# Patient Record
Sex: Male | Born: 1985
Health system: Southern US, Community
[De-identification: ages and names within clinical notes are randomized; demographics above are authoritative.]

## PROBLEM LIST (undated history)

## (undated) DIAGNOSIS — J45909 Unspecified asthma, uncomplicated: Secondary | ICD-10-CM

## (undated) DIAGNOSIS — Y249XXA Unspecified firearm discharge, undetermined intent, initial encounter: Secondary | ICD-10-CM

## (undated) DIAGNOSIS — I2699 Other pulmonary embolism without acute cor pulmonale: Secondary | ICD-10-CM

## (undated) DIAGNOSIS — W3400XA Accidental discharge from unspecified firearms or gun, initial encounter: Secondary | ICD-10-CM

## (undated) HISTORY — PX: HERNIA REPAIR: SHX51

## (undated) HISTORY — PX: INCISION AND DRAINAGE WOUND WITH FOREIGN BODY REMOVAL: SHX5635

---
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
## 2006-05-23 ENCOUNTER — Ambulatory Visit: Payer: Self-pay | Admitting: Oncology

## 2006-08-07 ENCOUNTER — Ambulatory Visit: Payer: Self-pay | Admitting: Oncology

## 2006-08-25 ENCOUNTER — Inpatient Hospital Stay (HOSPITAL_COMMUNITY): Admission: EM | Admit: 2006-08-25 | Discharge: 2006-08-28 | Payer: Self-pay | Admitting: Emergency Medicine

## 2006-11-13 ENCOUNTER — Ambulatory Visit (HOSPITAL_COMMUNITY): Payer: Self-pay | Admitting: Psychiatry

## 2010-10-25 NOTE — H&P (Signed)
Jamie Hood, Jamie Hood              ACCOUNT NO.:  [id number]   MEDICAL RECORD NO.:  [id number]          PATIENT TYPE:  IPS   LOCATION:  0204                          FACILITY:  [facility]   PHYSICIAN:  Lalla Brothers, MDDATE OF BIRTH:  November 10, 1985   DATE OF ADMISSION:  10/07/2004  DATE OF DISCHARGE:                         PSYCHIATRIC ADMISSION ASSESSMENT   IDENTIFICATION:  This 25 year old male, senior in high school, is admitted  emergently voluntarily in transfer from [facility]  for inpatient psychiatric stabilization and treatment of suicidal ideation,  reporting a disguised suicide plan to make it look like an accident. He  would not disclose further details except that he had overdosed with Aleve  and of possibly other medications in the past. He was documented to be  depressed acutely over the last 2-6 months and to have longstanding  psychiatric uncertainties since the seventh grade.   HISTORY OF PRESENT ILLNESS:  The patient indicates that he doubts mother is  supportive though mother does bring him for the emergency evaluation.  [facility] forwards a summary of their assessment  concluding sufficient risk with his symptoms and stated plans to need  inpatient hospitalization. The patient is not more specific about previous  overdose except to clarify that he had and overdose with Aleve and possibly  other medications at times. He did not seek or receive medical attention to  these and has had no previous mental health treatment. He has continued to  attempt to cope himself with the problems rather than seeking help from  others except possibly from mother who he considers to be not supportive. He  seems to be alienating to mother even as father is not definitely involved  in his daily life. Mother seems exhausted with the patient's problems and  they seem to suggest that biological father may have attempted contact with  the patient  more recently. The patient does not disclose such but indicates  that father has had no contact but he thinks that mother may know that  paternal grandmother may have some mental illness. Patient described  progressive avoidance in daily life since the seventh grade school year. He  suggests his grades have slowly declined since that middle school year so  that he is now failing in his senior year. The patient hopes he can graduate  but seems helpless and hopeless that it can truly happen. He seems to have a  sense of failure and rumination about missed chances. He has had severe  sadness this school year with diminished concentration, diminished appetite  and possibly some weight loss. His hopelessness and helplessness render him  vulnerable to stress and he now doubts that mother wants to help him or can  help him. He has used cannabis and alcohol socially at parties. He is not  currently employed that can be determined. He seems to present both avoidant  neurotic anxiety over time as well as more recent progressive major  depression this school year. He has diminished concentration, slowed  communication and loss of interest in ways that render assessment  challenging to secure.  The patient likewise is indecisive about considering  any medications. He states he just wants to figure out what is happening to  him and what he can do next to help his desperate situation. He has no  previous mental health care. He denies other consequences from cannabis and  alcohol, used socially. He denies hallucinations or delusions. He has had no  paranoia or other misperceptions. He does not describe dissociative process.  He seems to possibly be stating that he would have some beneficial closure  to his adolescent life if father were involved but he seems to indicate that  there is actually no involvement at all of his father. He does not  acknowledge post-traumatic stress or flashbacks. However, he does  seem to  have some longstanding anxiety now complicated by superimposed major  depression. He is on no medications at time of admission.   PAST MEDICAL HISTORY:  The patient has had no organic central nervous system  trauma. He had chicken pox at age 86. He had mild asthma in the past as a  cause of mild dyspnea, though no treatment was required. He notes his  appetite is diminished now and he may have lost some weight. He is under the  primary care of Dr. Arrie Aran at [facility]. He is allergic  to [facility]. He has no medication allergies. He is on no current  medications. He has had no seizure or syncope. He has no heart murmur or  arrhythmia.   REVIEW OF SYSTEMS:  The patient denies difficulty with gait, gaze or  continence. He denies exposure to communicable disease or toxins. He denies  rash, jaundice or purpura. There is no chest pain, palpitations or  presyncope currently. There is no abdominal pain, nausea, vomiting or  diarrhea. There is no dysuria or arthralgia.   IMMUNIZATIONS:  Up-to-date.   FAMILY HISTORY:  Father is uninvolved in his life but he thinks his mother  may know that paternal grandmother likely has some mental illness. He thinks  mother is currently exhausted with his problems and himself and not going to  be of any help or support to him. He is not making it on his own in this  regard. He is failing classes though he states he would like to graduate the  12th grade this year. He is making no inroads into assessing his current  status or interventions that might be possible or necessary.   SOCIAL AND DEVELOPMENTAL HISTORY:  The patient knows of no early  developmental delays. He knows of no learning disorders. He suggests his  grades were good in the seventh grade and has only slowly declined so that  he has only been failing the latter part of this school year. The patient does not pay attention to these details or provide significant history  in  regard to the chronological course of symptoms. He does not acknowledge  sexual activity or serious relationships with others. He denies any legal  consequences. He does use cannabis and alcohol sometimes socially at  parties. He does not describe other consequences. He is currently  uninterested in any type of party or substance use. He is currently  uninterested in relationships and seeks to just figure out how to get his  life going again and possibly to restore communication and relations with  mother.   ASSETS:  The patient seems to have adequate intelligence.   MENTAL STATUS EXAM:  Height is 71 inches and weight is 174 pounds. Blood  pressure is 118/76 with heart rate of 52 (sitting) and 125/80 with heart  rate of 51 (standing). The patient is right-handed. He is alert and oriented  though he has moderate to severe psychomotor slowing. Cranial nerves are  intact. AMRs are 0/0. Muscle strengths and tone are normal. There are no  pathologic reflexes or soft neurologic findings. There are no abnormal  involuntary movements. Gait and gaze are intact. The patient has marked  psychomotor slowing and diminished concentration. He perceives his family as  being overwhelmed with his symptoms and thereby unable to face the question  of starting pharmacotherapy for depression. He seems to have guilty  ruminations including relative to his history of overdose, loss of father  and consequences of early years of development. He is avoidant and neurotic  in his anxiety. His suicidal ideation and disguised plan seem anxious and  depressed, though without psychotic, manic or dissociative features.   IMPRESSION:   AXIS I:  1. Major depression, single episode, severe.  2. Anxiety disorder not otherwise specified with avoidant features.  3. Parent-child problem.  4. Other specified family circumstances.  5. Other interpersonal problem.     AXIS II:  Diagnosis deferred.   AXIS III:  1.  Mild asthma by history.  2. Allergy to SHELLFISH.     AXIS IV:  Stressors:  Family--severe, acute and chronic; phase of life--  severe, acute and chronic; school--severe, acute and chronic.   AXIS V:  Global Assessment of Functioning 34; highest in last year estimated  at 72.   PLAN:  The patient is admitted for inpatient adolescent psychiatric and  multidisciplinary multimodal behavioral health treatment in a team-based  program at a locked psychiatric unit. Educational material on Zoloft  pharmacotherapy will be provided when the patient is willing to review,  though he currently states he needs some time and space to understand his  problems. Cognitive behavioral therapy, object relations family therapy,  individuation and separation, identity consolidation, substance abuse  intervention, and anger management can be addressed.   ESTIMATED LENGTH OF STAY:  Six to seven days with target symptoms for discharge being stabilization of suicide risk and mood, stabilization of  anxiety, and generalization of the capacity for safe, effective  participation in outpatient treatment as family communication and relations  are restored.      GEJ/MEDQ  D:  10/07/2004  T:  10/07/2004  Job:  [id number]
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
## 2012-08-14 ENCOUNTER — Encounter (HOSPITAL_COMMUNITY): Payer: Self-pay

## 2012-08-14 ENCOUNTER — Emergency Department (HOSPITAL_COMMUNITY): Payer: 59

## 2012-08-14 ENCOUNTER — Emergency Department (HOSPITAL_COMMUNITY)
Admission: EM | Admit: 2012-08-14 | Discharge: 2012-08-14 | Disposition: A | Discharge status: Home / Self Care | Payer: 59 | Attending: Emergency Medicine | Admitting: Emergency Medicine

## 2012-08-14 DIAGNOSIS — Y9383 Activity, rough housing and horseplay: Secondary | ICD-10-CM | POA: Insufficient documentation

## 2012-08-14 DIAGNOSIS — Y929 Unspecified place or not applicable: Secondary | ICD-10-CM | POA: Insufficient documentation

## 2012-08-14 DIAGNOSIS — J45909 Unspecified asthma, uncomplicated: Secondary | ICD-10-CM | POA: Insufficient documentation

## 2012-08-14 DIAGNOSIS — IMO0002 Reserved for concepts with insufficient information to code with codable children: Secondary | ICD-10-CM | POA: Insufficient documentation

## 2012-08-14 DIAGNOSIS — S43401A Unspecified sprain of right shoulder joint, initial encounter: Secondary | ICD-10-CM

## 2012-08-14 HISTORY — DX: Unspecified asthma, uncomplicated: J45.909

## 2012-08-14 MED ORDER — IBUPROFEN 400 MG PO TABS
800.0000 mg | ORAL_TABLET | Freq: Once | ORAL | Status: AC
  Administered 2012-08-14: 800 mg via ORAL
  Filled 2012-08-14: qty 2

## 2012-08-14 MED ORDER — IBUPROFEN 800 MG PO TABS: 800.0000 mg | ORAL_TABLET | Freq: Three times a day (TID) | ORAL | Status: DC

## 2012-08-14 MED ORDER — HYDROCODONE-ACETAMINOPHEN 5-325 MG PO TABS: 1.0000 | ORAL_TABLET | ORAL | Status: DC | PRN

## 2012-08-14 MED ORDER — ONDANSETRON HCL 4 MG PO TABS: 4.0000 mg | ORAL_TABLET | Freq: Four times a day (QID) | ORAL | Status: DC
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
## 2012-08-14 NOTE — ED Provider Notes (Signed)
Medical screening examination/treatment/procedure(s) were performed by non-physician practitioner and as supervising physician I was immediately available for consultation/collaboration.  Starsky Nanna, MD 08/14/12 1654 
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
## 2014-10-13 ENCOUNTER — Emergency Department (HOSPITAL_COMMUNITY)
Admission: EM | Admit: 2014-10-13 | Discharge: 2014-10-13 | Disposition: A | Discharge status: Home / Self Care | Payer: 59 | Attending: Emergency Medicine | Admitting: Emergency Medicine

## 2014-10-13 ENCOUNTER — Encounter (HOSPITAL_COMMUNITY): Payer: Self-pay | Admitting: *Deleted

## 2014-10-13 ENCOUNTER — Emergency Department (HOSPITAL_COMMUNITY): Payer: 59

## 2014-10-13 DIAGNOSIS — R079 Chest pain, unspecified: Secondary | ICD-10-CM | POA: Diagnosis present

## 2014-10-13 DIAGNOSIS — Z791 Long term (current) use of non-steroidal anti-inflammatories (NSAID): Secondary | ICD-10-CM | POA: Insufficient documentation

## 2014-10-13 DIAGNOSIS — J45909 Unspecified asthma, uncomplicated: Secondary | ICD-10-CM | POA: Insufficient documentation

## 2014-10-13 DIAGNOSIS — Z86711 Personal history of pulmonary embolism: Secondary | ICD-10-CM | POA: Insufficient documentation

## 2014-10-13 DIAGNOSIS — R0789 Other chest pain: Secondary | ICD-10-CM | POA: Diagnosis not present

## 2014-10-13 HISTORY — DX: Other pulmonary embolism without acute cor pulmonale: I26.99

## 2014-10-13 LAB — CBC
HEMATOCRIT: 40.8 % (ref 39.0–52.0)
HEMOGLOBIN: 13.8 g/dL (ref 13.0–17.0)
MCH: 28.2 pg (ref 26.0–34.0)
MCHC: 33.8 g/dL (ref 30.0–36.0)
MCV: 83.3 fL (ref 78.0–100.0)
Platelets: 144 10*3/uL — ABNORMAL LOW (ref 150–400)
RBC: 4.9 MIL/uL (ref 4.22–5.81)
RDW: 13.6 % (ref 11.5–15.5)
WBC: 4.1 10*3/uL (ref 4.0–10.5)

## 2014-10-13 LAB — I-STAT TROPONIN, ED: TROPONIN I, POC: 0 ng/mL (ref 0.00–0.08)

## 2014-10-13 LAB — BASIC METABOLIC PANEL
ANION GAP: 10 (ref 5–15)
BUN: 14 mg/dL (ref 6–20)
CALCIUM: 9.3 mg/dL (ref 8.9–10.3)
CO2: 24 mmol/L (ref 22–32)
Chloride: 107 mmol/L (ref 101–111)
Creatinine, Ser: 1.11 mg/dL (ref 0.61–1.24)
GFR calc Af Amer: >60 mL/min (ref >60)
Glucose, Bld: 126 mg/dL — ABNORMAL HIGH (ref 70–99)
Potassium: 3.7 mmol/L (ref 3.5–5.1)
SODIUM: 141 mmol/L (ref 135–145)

## 2014-10-13 LAB — D-DIMER, QUANTITATIVE: D-Dimer, Quant: 0.48 ug/mL-FEU (ref 0.00–0.48)

## 2014-10-13 MED ORDER — ALBUTEROL SULFATE HFA 108 (90 BASE) MCG/ACT IN AERS: 1.0000 | INHALATION_SPRAY | Freq: Four times a day (QID) | RESPIRATORY_TRACT | Status: DC | PRN

## 2014-10-13 MED ORDER — ESOMEPRAZOLE MAGNESIUM 40 MG PO CPDR: 40.0000 mg | DELAYED_RELEASE_CAPSULE | Freq: Every day | ORAL | Status: DC

## 2014-10-13 NOTE — ED Notes (Signed)
Pt reports recent history of PE, pt was on xarelto but stopped taking it 8 months ago. Now having mid chest pains since sept, denies sob.

## 2014-10-13 NOTE — ED Provider Notes (Signed)
CSN: [id number]     Arrival date & time 10/13/14  1851 History   First MD Initiated Contact with Patient 10/13/14 2058     Chief Complaint  Patient presents with  . Chest Pain     (Consider location/radiation/quality/duration/timing/severity/associated sxs/prior Treatment) HPI   Jamie Hood is a(n) 29 y.o. male who presents to the ED for c/o cp. It has been on going for several months.  Better in the morning and worse at night. He states that he has tightness and cough with exertion. He denies pressure, nausea, SOB, diaphoresis, vomiting. His girlfriend states that he does smoke plaque in mild daily as well as drink 3-6 beers a night. He has a history of gastritis, he is taking one aspirin daily. He has a history of previous blood clot and has been off his Xarelto for 8 months.   Past Medical History  Diagnosis Date  . Asthma   . PE (pulmonary embolism)    Past Surgical History  Procedure Laterality Date  . Hernia repair    . Incision and drainage wound with foreign body removal     History reviewed. No pertinent family history. History  Substance Use Topics  . Smoking status: Never Smoker   . Smokeless tobacco: Not on file  . Alcohol Use: Yes     Comment: social    Review of Systems  Ten systems reviewed and are negative for acute change, except as noted in the HPI.    Allergies  Shellfish allergy  Home Medications   Prior to Admission medications   Medication Sig Start Date End Date Taking? Authorizing Provider  acetaminophen (TYLENOL) 500 MG tablet Take by mouth every 6 (six) hours as needed for pain.    Historical Provider, MD  HYDROcodone-acetaminophen (NORCO/VICODIN) 5-325 MG per tablet Take 1 tablet by mouth every 4 (four) hours as needed for pain. 08/14/12   Fayrene HelperBowie Tran, PA-C  ibuprofen (ADVIL,MOTRIN) 800 MG tablet Take 1 tablet (800 mg total) by mouth 3 (three) times daily. 08/14/12   Fayrene HelperBowie Tran, PA-C  ondansetron (ZOFRAN) 4 MG tablet Take 1 tablet (4 mg total)  by mouth every 6 (six) hours. 08/14/12   Fayrene HelperBowie Tran, PA-C   BP 114/70 mmHg  Pulse 66  Temp(Src) 99 F (37.2 C) (Oral)  Resp 16  Ht 6\' 1"  (1.854 m)  Wt 165 lb (74.844 kg)  BMI 21.77 kg/m2  SpO2 97% Physical Exam  Constitutional: He appears well-developed and well-nourished. No distress.  HENT:  Head: Normocephalic and atraumatic.  Eyes: Conjunctivae are normal. No scleral icterus.  Neck: Normal range of motion. Neck supple.  Cardiovascular: Normal rate, regular rhythm and normal heart sounds.   Pulmonary/Chest: Effort normal and breath sounds normal. No respiratory distress.  Abdominal: Soft. There is no tenderness.  Musculoskeletal: He exhibits no edema.  Neurological: He is alert.  Skin: Skin is warm and dry. He is not diaphoretic.  Psychiatric: His behavior is normal.  Nursing note and vitals reviewed.   ED Course  Procedures (including critical care time) Labs Review Labs Reviewed  CBC - Abnormal; Notable for the following:    Platelets 144 (*)    All other components within normal limits  BASIC METABOLIC PANEL - Abnormal; Notable for the following:    Glucose, Bld 126 (*)    All other components within normal limits  D-DIMER, QUANTITATIVE  I-STAT TROPOININ, ED    Imaging Review Dg Chest 2 View  10/13/2014   CLINICAL DATA:  Chest pain and right  flank pain. Shortness of breath for 6 months, worsening. History of asthma.  EXAM: CHEST  2 VIEW  COMPARISON:  None.  FINDINGS: The cardiomediastinal silhouette is within normal limits. There is mild airway thickening. No airspace consolidation, edema, pleural effusion, or pneumothorax is identified. No acute osseous abnormality is seen.  IMPRESSION: Airway thickening which may reflect bronchitis or reactive airways disease.   Electronically Signed   By: Sebastian AcheAllen  Grady   On: 10/13/2014 20:05     EKG Interpretation None      ED ECG REPORT   Date: 10/13/2014  EKG Time: 9:33 PM  Rate: 76  Rhythm: sinus arrhythmia,  normal EKG,  normal sinus rhythm, unchanged from previous tracings  Axis: normal  Intervals:normal  ST&T Change: none  Narrative Interpretation: Normal ECG with sinus arrhythmia            MDM   Final diagnoses:  None   9:31 PM BP 114/70 mmHg  Pulse 66  Temp(Src) 99 F (37.2 C) (Oral)  Resp 16  Ht 6\' 1"  (1.854 m)  Wt 165 lb (74.844 kg)  BMI 21.77 kg/m2  SpO2 97% Chest x-ray shows mild bronchitic changes suggestive of reactive airway. He has a slightly elevated blood glucose today. Slightly low platelets. Negative d-dimer, heart score of 1. EKG is unremarkable. Negative d-dimer The patient likely has reflux and asthmatic symptoms. We'll discharge with albuterol. I discussed lifestyle modifications including decreasing alcohol intake and smoking. Patient has followed up appointment with primary care physician in June. He appears safe for discharge at this time,   Arthor Captainbigail Deondrae Mcgrail, Cordelia Poche-C 10/13/14 2134  Blane OharaJoshua Zavitz, MD 10/14/14 [phone]

## 2014-10-13 NOTE — Discharge Instructions (Signed)
Your caregiver has diagnosed you as having chest pain that is not specific for one problem, but does not require admission.  You are at low risk for an acute heart condition or other serious illness. Chest pain comes from many different causes.  SEEK IMMEDIATE MEDICAL ATTENTION IF: You have severe chest pain, especially if the pain is crushing or pressure-like and spreads to the arms, back, neck, or jaw, or if you have sweating, nausea (feeling sick to your stomach), or shortness of breath. THIS IS AN EMERGENCY. Don't wait to see if the pain will go away. Get medical help at once. Call 911 or 0 (operator). DO NOT drive yourself to the hospital.  Your chest pain gets worse and does not go away with rest.  You have an attack of chest pain lasting longer than usual, despite rest and treatment with the medications your caregiver has prescribed.  You wake from sleep with chest pain or shortness of breath.  You feel dizzy or faint.  You have chest pain not typical of your usual pain for which you originally saw your caregiver.  Exercise-Induced Asthma  Asthma is a condition in which the airways in the lungs (bronchioles) tend to constrict more than normal due to muscle spasms. This constriction results in difficulty in breathing (shortness of breath, wheezing, or coughing). For some people the symptoms are caused or triggered by physical activity; this is known as exercise-induced asthma. SYMPTOMS  Shortness of breath. Wheezing. Coughing. Chest tightness. Decrease in optimal performance. Fatigue. POSSIBLE TRIGGERS: Exercise-induced asthma may occur more often when one or more of the following are present:  Animal dander from the skin, hair, or feathers of animals. Dust mites contained in house dust. Cockroaches. Pollen from trees or grass. Mold. Cigarette or tobacco smoke. Smoking cannot be allowed in homes of people with asthma. People with asthma should not smoke and should not be around  smokers. Air pollutants such as dust, household cleaners, hair sprays, aerosol sprays, paint fumes, strong chemicals, or strong odors. Cold air or weather changes. Cold air may cause inflammation. Winds increase molds and pollens in the air. There is not one best climate for people with asthma. Strong emotions, such as crying or laughing hard. Stress. Certain medicines, such as aspirin or beta-blockers. Sulfites in foods and drinks, such as dried fruits and wine. Infections or inflammatory conditions such as the flu, a cold, or an inflammation of the nasal membranes (rhinitis). Gastroesophageal reflux disease (GERD). GERD is a condition where stomach acid backs up into your throat (esophagus). Exercise or strenuous activity. Proper pre-exercise medicines allow most people to participate in sports. PREVENTION  Know the triggers that may increase your occurrence for exercise-induced asthma and avoid them. During winter you may need to exercise indoors or wear a mask if you do exercise outdoors. Breathing through the nose instead of the mouth, especially in the winter. Warm up for an appropriate length of time before a vigorous workout. Take controller and reliever medicines to control your asthma as directed. Follow up with your caregiver as directed. TREATMENT  Asthma controller and reliever medicines work well for most people suffering from exercise-induced asthma. Medicines are able to prevent asthma attacks as well as treat attacks already happening. The most common type of medicine for asthma is called a bronchodilator. Bronchodilators act by expanding the constricted airways. The most common type of bronchodilator is albuterol and should be taken 15 to 30 minutes before physical activity and as soon as symptoms begin to  appear. Additional medicines, such as cromolyn and nedocromil, may be prescribed by your caregiver. It is important for all people with asthma to use their medicines as directed  by their caregiver. Document Released: 05/26/2005 Document Revised: 10/10/2013 Document Reviewed: 09/07/2008 [facility] Patient Information 2015 [place], [place]. This information is not intended to replace advice given to you by your health care provider. Make sure you discuss any questions you have with your health care provider. Gastroesophageal Reflux Disease, Adult Gastroesophageal reflux disease (GERD) happens when acid from your stomach flows up into the esophagus. When acid comes in contact with the esophagus, the acid causes soreness (inflammation) in the esophagus. Over time, GERD may create small holes (ulcers) in the lining of the esophagus. CAUSES   Increased body weight. This puts pressure on the stomach, making acid rise from the stomach into the esophagus.  Smoking. This increases acid production in the stomach.  Drinking alcohol. This causes decreased pressure in the lower esophageal sphincter (valve or ring of muscle between the esophagus and stomach), allowing acid from the stomach into the esophagus.  Late evening meals and a full stomach. This increases pressure and acid production in the stomach.  A malformed lower esophageal sphincter. Sometimes, no cause is found. SYMPTOMS   Burning pain in the lower part of the mid-chest behind the breastbone and in the mid-stomach area. This may occur twice a week or more often.  Trouble swallowing.  Sore throat.  Dry cough.  Asthma-like symptoms including chest tightness, shortness of breath, or wheezing. DIAGNOSIS  Your caregiver may be able to diagnose GERD based on your symptoms. In some cases, X-rays and other tests may be done to check for complications or to check the condition of your stomach and esophagus. TREATMENT  Your caregiver may recommend over-the-counter or prescription medicines to help decrease acid production. Ask your caregiver before starting or adding any new medicines.  HOME CARE INSTRUCTIONS   Change  the factors that you can control. Ask your caregiver for guidance concerning weight loss, quitting smoking, and alcohol consumption.  Avoid foods and drinks that make your symptoms worse, such as:  Caffeine or alcoholic drinks.  Chocolate.  Peppermint or mint flavorings.  Garlic and onions.  Spicy foods.  Citrus fruits, such as oranges, lemons, or limes.  Tomato-based foods such as sauce, chili, salsa, and pizza.  Fried and fatty foods.  Avoid lying down for the 3 hours prior to your bedtime or prior to taking a nap.  Eat small, frequent meals instead of large meals.  Wear loose-fitting clothing. Do not wear anything tight around your waist that causes pressure on your stomach.  Raise the head of your bed 6 to 8 inches with wood blocks to help you sleep. Extra pillows will not help.  Only take over-the-counter or prescription medicines for pain, discomfort, or fever as directed by your caregiver.  Do not take aspirin, ibuprofen, or other nonsteroidal anti-inflammatory drugs (NSAIDs). SEEK IMMEDIATE MEDICAL CARE IF:   You have pain in your arms, neck, jaw, teeth, or back.  Your pain increases or changes in intensity or duration.  You develop nausea, vomiting, or sweating (diaphoresis).  You develop shortness of breath, or you faint.  Your vomit is green, yellow, black, or looks like coffee grounds or blood.  Your stool is red, bloody, or black. These symptoms could be signs of other problems, such as heart disease, gastric bleeding, or esophageal bleeding. MAKE SURE YOU:   Understand these instructions.  Will watch your  condition.  Will get help right away if you are not doing well or get worse. Document Released: 03/05/2005 Document Revised: 08/18/2011 Document Reviewed: 12/13/2010 [facility] Patient Information 2015 [place], [place]. This information is not intended to replace advice given to you by your health care provider. Make sure you discuss any questions you  have with your health care provider.

## 2014-10-16 ENCOUNTER — Emergency Department (HOSPITAL_COMMUNITY)
Admission: EM | Admit: 2014-10-16 | Discharge: 2014-10-17 | Disposition: A | Discharge status: Other Acute Inpatient Hospital | Payer: 59 | Attending: Emergency Medicine | Admitting: Emergency Medicine

## 2014-10-16 ENCOUNTER — Encounter (HOSPITAL_COMMUNITY): Payer: Self-pay

## 2014-10-16 DIAGNOSIS — Z7982 Long term (current) use of aspirin: Secondary | ICD-10-CM | POA: Diagnosis not present

## 2014-10-16 DIAGNOSIS — Z79899 Other long term (current) drug therapy: Secondary | ICD-10-CM | POA: Diagnosis not present

## 2014-10-16 DIAGNOSIS — J45909 Unspecified asthma, uncomplicated: Secondary | ICD-10-CM | POA: Insufficient documentation

## 2014-10-16 DIAGNOSIS — F332 Major depressive disorder, recurrent severe without psychotic features: Secondary | ICD-10-CM | POA: Insufficient documentation

## 2014-10-16 DIAGNOSIS — R45851 Suicidal ideations: Secondary | ICD-10-CM | POA: Diagnosis present

## 2014-10-16 DIAGNOSIS — F1410 Cocaine abuse, uncomplicated: Secondary | ICD-10-CM | POA: Insufficient documentation

## 2014-10-16 DIAGNOSIS — Z86711 Personal history of pulmonary embolism: Secondary | ICD-10-CM | POA: Diagnosis not present

## 2014-10-16 LAB — RAPID URINE DRUG SCREEN, HOSP PERFORMED
Amphetamines: NOT DETECTED
Barbiturates: NOT DETECTED
Benzodiazepines: NOT DETECTED
Cocaine: POSITIVE — AB
Opiates: NOT DETECTED
Tetrahydrocannabinol: NOT DETECTED

## 2014-10-16 LAB — BASIC METABOLIC PANEL
ANION GAP: 5 (ref 5–15)
BUN: 15 mg/dL (ref 6–20)
CALCIUM: 9 mg/dL (ref 8.9–10.3)
CO2: 25 mmol/L (ref 22–32)
Chloride: 110 mmol/L (ref 101–111)
Creatinine, Ser: 1.16 mg/dL (ref 0.61–1.24)
GLUCOSE: 75 mg/dL (ref 70–99)
Potassium: 3.2 mmol/L — ABNORMAL LOW (ref 3.5–5.1)
Sodium: 140 mmol/L (ref 135–145)

## 2014-10-16 LAB — CBC
HCT: 40.7 % (ref 39.0–52.0)
Hemoglobin: 13.3 g/dL (ref 13.0–17.0)
MCH: 27.9 pg (ref 26.0–34.0)
MCHC: 32.7 g/dL (ref 30.0–36.0)
MCV: 85.5 fL (ref 78.0–100.0)
PLATELETS: 137 10*3/uL — AB (ref 150–400)
RBC: 4.76 MIL/uL (ref 4.22–5.81)
RDW: 13.8 % (ref 11.5–15.5)
WBC: 5.1 10*3/uL (ref 4.0–10.5)

## 2014-10-16 LAB — I-STAT TROPONIN, ED: TROPONIN I, POC: 0 ng/mL (ref 0.00–0.08)

## 2014-10-16 LAB — SALICYLATE LEVEL

## 2014-10-16 LAB — ETHANOL: ALCOHOL ETHYL (B): 50 mg/dL — AB (ref <5)

## 2014-10-16 LAB — ACETAMINOPHEN LEVEL: Acetaminophen (Tylenol), Serum: <10 ug/mL — ABNORMAL LOW (ref 10–30)

## 2014-10-16 MED ORDER — ONDANSETRON HCL 4 MG PO TABS: 4.0000 mg | ORAL_TABLET | Freq: Three times a day (TID) | ORAL | Status: DC | PRN

## 2014-10-16 MED ORDER — ASPIRIN EC 81 MG PO TBEC
81.0000 mg | DELAYED_RELEASE_TABLET | Freq: Every day | ORAL | Status: DC
  Administered 2014-10-16: 81 mg via ORAL
  Filled 2014-10-16: qty 1

## 2014-10-16 MED ORDER — NICOTINE 7 MG/24HR TD PT24: 7.0000 mg | MEDICATED_PATCH | Freq: Every day | TRANSDERMAL | Status: DC

## 2014-10-16 MED ORDER — ADULT MULTIVITAMIN LIQUID CH
Freq: Every day | ORAL | Status: DC
  Administered 2014-10-16: 5 mL via ORAL
  Filled 2014-10-16: qty 5

## 2014-10-16 MED ORDER — IBUPROFEN 200 MG PO TABS: 600.0000 mg | ORAL_TABLET | Freq: Three times a day (TID) | ORAL | Status: DC | PRN

## 2014-10-16 MED ORDER — ZOLPIDEM TARTRATE 5 MG PO TABS: 5.0000 mg | ORAL_TABLET | Freq: Every evening | ORAL | Status: DC | PRN

## 2014-10-16 MED ORDER — SERTRALINE HCL 50 MG PO TABS
25.0000 mg | ORAL_TABLET | Freq: Every day | ORAL | Status: DC
  Administered 2014-10-16: 25 mg via ORAL
  Filled 2014-10-16: qty 1

## 2014-10-16 MED ORDER — LORATADINE 10 MG PO TABS
10.0000 mg | ORAL_TABLET | Freq: Every day | ORAL | Status: DC
  Administered 2014-10-16: 10 mg via ORAL
  Filled 2014-10-16: qty 1

## 2014-10-16 MED ORDER — POTASSIUM CHLORIDE CRYS ER 20 MEQ PO TBCR
40.0000 meq | EXTENDED_RELEASE_TABLET | Freq: Once | ORAL | Status: AC
  Administered 2014-10-16: 40 meq via ORAL
  Filled 2014-10-16: qty 2

## 2014-10-16 MED ORDER — ALBUTEROL SULFATE HFA 108 (90 BASE) MCG/ACT IN AERS: 1.0000 | INHALATION_SPRAY | Freq: Four times a day (QID) | RESPIRATORY_TRACT | Status: DC | PRN

## 2014-10-16 MED ORDER — ALUM & MAG HYDROXIDE-SIMETH 200-200-20 MG/5ML PO SUSP
30.0000 mL | ORAL | Status: DC | PRN
  Administered 2014-10-16: 30 mL via ORAL
  Filled 2014-10-16: qty 30

## 2014-10-16 MED ORDER — LORAZEPAM 1 MG PO TABS: 1.0000 mg | ORAL_TABLET | Freq: Three times a day (TID) | ORAL | Status: DC | PRN

## 2014-10-16 MED ORDER — ACETAMINOPHEN 325 MG PO TABS: 650.0000 mg | ORAL_TABLET | ORAL | Status: DC | PRN

## 2014-10-16 NOTE — BH Assessment (Signed)
[facility] Assessment Progress Note  The following facilities have been contacted to seek placement for this patient with results as noted:  At capacity:  [facility]  Call rolled to voice mail, left message, awaiting call back:  [facility]  Jamie Nooney, MA Triage Specialist [phone]     

## 2014-10-16 NOTE — ED Notes (Addendum)
Pt presents with c/o suicidal ideation and depression. Pt reports he has struggled with this issue before. Pt denies any HI. Pt denies any plans for suicide, thoughts only. Calm and cooperative in triage. Pt also c/o chest pain, pt has a hx of PE, [facility].

## 2014-10-16 NOTE — ED Notes (Signed)
Patient complains of abdominal pain. Patient rates pain a 5/10 using numeric pain scale. Respirations equal and unlabored, skin warm and dry. NAD. Will medicated patient per MAR. Q 15 min safety checks remain in place.

## 2014-10-16 NOTE — ED Notes (Signed)
Patient and belongings wanded by security.  
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
## 2014-10-16 NOTE — ED Provider Notes (Signed)
CSN: [id number]     Arrival date & time 10/16/14  0026 History   First MD Initiated Contact with Patient 10/16/14 0239     Chief Complaint  Patient presents with  . Suicidal  . Chest Pain     (Consider location/radiation/quality/duration/timing/severity/associated sxs/prior Treatment) Patient is a 29 y.o. male presenting with chest pain. The history is provided by the patient.  Chest Pain He has a history of depression and he has been more depressed over the last 2 weeks. He has had suicidal thoughts during this time but no specific suicidal plan. He admits to early morning awakening, crying spells, and anhedonia. He has had some vague visual hallucinations like daydream but has not been hearing any voices. He had been admitted for depression along time ago but is not under ongoing psychiatric care. He also has some very mild chest pain. He had been evaluated for this 3 days ago. He does admit to cocaine use yesterday. He admits to binge drinking and smoking plaque and mild cigars. He denies other street drug use.  Past Medical History  Diagnosis Date  . Asthma   . PE (pulmonary embolism)    Past Surgical History  Procedure Laterality Date  . Hernia repair    . Incision and drainage wound with foreign body removal     No family history on file. History  Substance Use Topics  . Smoking status: Never Smoker   . Smokeless tobacco: Not on file  . Alcohol Use: Yes     Comment: social    Review of Systems  Cardiovascular: Positive for chest pain.  All other systems reviewed and are negative.     Allergies  Shellfish allergy  Home Medications   Prior to Admission medications   Medication Sig Start Date End Date Taking? Authorizing Provider  albuterol (PROVENTIL HFA;VENTOLIN HFA) 108 (90 BASE) MCG/ACT inhaler Inhale 1-2 puffs into the lungs every 6 (six) hours as needed for wheezing or shortness of breath. 10/13/14  Yes Arthor CaptainAbigail Harris, PA-C  aspirin EC 81 MG tablet Take 81 mg by  mouth daily.   Yes Historical Provider, MD  loratadine (CLARITIN) 10 MG tablet Take 10 mg by mouth daily.   Yes Historical Provider, MD  Multiple Vitamins-Minerals (MULTIVITAMIN & MINERAL PO) Take 1 tablet by mouth daily.   Yes Historical Provider, MD  esomeprazole (NEXIUM) 40 MG capsule Take 1 capsule (40 mg total) by mouth daily. Patient not taking: Reported on 10/16/2014 10/13/14   Arthor CaptainAbigail Harris, PA-C  HYDROcodone-acetaminophen (NORCO/VICODIN) 5-325 MG per tablet Take 1 tablet by mouth every 4 (four) hours as needed for pain. Patient not taking: Reported on 10/13/2014 08/14/12   Fayrene HelperBowie Tran, PA-C  ibuprofen (ADVIL,MOTRIN) 800 MG tablet Take 1 tablet (800 mg total) by mouth 3 (three) times daily. Patient not taking: Reported on 10/13/2014 08/14/12   Fayrene HelperBowie Tran, PA-C  ondansetron (ZOFRAN) 4 MG tablet Take 1 tablet (4 mg total) by mouth every 6 (six) hours. Patient not taking: Reported on 10/13/2014 08/14/12   Fayrene HelperBowie Tran, PA-C   BP 132/88 mmHg  Pulse 94  Temp(Src) 98.3 F (36.8 C) (Oral)  Resp 20  SpO2 100% Physical Exam  Nursing note and vitals reviewed.  29 year old male, resting comfortably and in no acute distress. Vital signs are normal. Oxygen saturation is 100%, which is normal. Head is normocephalic and atraumatic. PERRLA, EOMI. Oropharynx is clear. Neck is nontender and supple without adenopathy or JVD. Back is nontender and there is no CVA tenderness.  Lungs are clear without rales, wheezes, or rhonchi. Chest is nontender. Heart has regular rate and rhythm without murmur. Abdomen is soft, flat, nontender without masses or hepatosplenomegaly and peristalsis is normoactive. Extremities have no cyanosis or edema, full range of motion is present. Skin is warm and dry without rash. Neurologic: Mental status is normal, cranial nerves are intact, there are no motor or sensory deficits. Psychiatric: Depressed affect. He speaks in a soft, monotone voice and makes a report eye contact.  ED Course   Procedures (including critical care time) Labs Review Results for orders placed or performed during the hospital encounter of 10/16/14  CBC  Result Value Ref Range   WBC 5.1 4.0 - 10.5 K/uL   RBC 4.76 4.22 - 5.81 MIL/uL   Hemoglobin 13.3 13.0 - 17.0 g/dL   HCT 64.440.7 03.439.0 - 74.252.0 %   MCV 85.5 78.0 - 100.0 fL   MCH 27.9 26.0 - 34.0 pg   MCHC 32.7 30.0 - 36.0 g/dL   RDW 59.513.8 63.811.5 - 75.615.5 %   Platelets 137 (L) 150 - 400 K/uL  Basic metabolic panel  Result Value Ref Range   Sodium 140 135 - 145 mmol/L   Potassium 3.2 (L) 3.5 - 5.1 mmol/L   Chloride 110 101 - 111 mmol/L   CO2 25 22 - 32 mmol/L   Glucose, Bld 75 70 - 99 mg/dL   BUN 15 6 - 20 mg/dL   Creatinine, Ser 4.331.16 0.61 - 1.24 mg/dL   Calcium 9.0 8.9 - 29.510.3 mg/dL   GFR calc non Af Amer >60 >60 mL/min   GFR calc Af Amer >60 >60 mL/min   Anion gap 5 5 - 15  Acetaminophen level  Result Value Ref Range   Acetaminophen (Tylenol), Serum <10 (L) 10 - 30 ug/mL  Ethanol (ETOH)  Result Value Ref Range   Alcohol, Ethyl (B) 50 (H) <5 mg/dL  Salicylate level  Result Value Ref Range   Salicylate Lvl <4.0 2.8 - 30.0 mg/dL  Urine Drug Screen  Result Value Ref Range   Opiates NONE DETECTED NONE DETECTED   Cocaine POSITIVE (A) NONE DETECTED   Benzodiazepines NONE DETECTED NONE DETECTED   Amphetamines NONE DETECTED NONE DETECTED   Tetrahydrocannabinol NONE DETECTED NONE DETECTED   Barbiturates NONE DETECTED NONE DETECTED  I-stat troponin, ED  [facility]  Result Value Ref Range   Troponin i, poc 0.00 0.00 - 0.08 ng/mL   Comment 3            EKG Interpretation   Date/Time:  Monday Oct 16 2014 00:38:53 EDT Ventricular Rate:  84 PR Interval:  173 QRS Duration: 91 QT Interval:  344 QTC Calculation: 407 R Axis:   77 Text Interpretation:  Sinus rhythm Probable left atrial enlargement  Otherwise within normal limits No old tracing to compare Confirmed by  [facility]  MD, Lakhia Gengler ([id number]) on 10/16/2014 12:57:22 AM      MDM   Final  diagnoses:  Suicidal ideation  Major depressive disorder, recurrent, severe without psychotic features    Depression with suicidal ideation. Chest pain of uncertain cause but doubt serious pathology. ECG and troponin are unremarkable. He will be placed in psychiatric holding pending Consultation with TTS. Old records are reviewed confirming ED visit 3 days ago for chest pain, and psychiatric hospitalization in 2008.    Dione Boozeavid Shahana Capes, MD 10/17/14 [phone]

## 2014-10-16 NOTE — BH Assessment (Signed)
Assessment completed. Consulted Jamie FessIjeoma Nwaeze, NP who recommended inpatient treatment. Dr. Preston FleetingGlick has been informed of the recommendation. Pt is willing to sign voluntary consent forms.

## 2014-10-16 NOTE — ED Notes (Signed)
MD at bedside. 
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
## 2014-10-16 NOTE — ED Notes (Signed)
[facility] plan to wrap my car around a tree. Patient denies HI and AVH at this time. Respirations equal and unlabored, skin warm and dry. NAD. Plan of care discussed with patient. Patient voices no complaints or concerns. Encouragement and support provided adn safety maintain. Q 15 min safety checks remain in place.

## 2014-10-17 ENCOUNTER — Inpatient Hospital Stay (HOSPITAL_COMMUNITY)
Admission: EM | Admit: 2014-10-17 | Discharge: 2014-10-21 | DRG: 885 | Disposition: A | Discharge status: Home / Self Care | Payer: 59 | Source: Intra-hospital | Attending: Psychiatry | Admitting: Psychiatry

## 2014-10-17 ENCOUNTER — Encounter (HOSPITAL_COMMUNITY): Payer: Self-pay | Admitting: *Deleted

## 2014-10-17 DIAGNOSIS — F329 Major depressive disorder, single episode, unspecified: Secondary | ICD-10-CM | POA: Diagnosis present

## 2014-10-17 DIAGNOSIS — R45851 Suicidal ideations: Secondary | ICD-10-CM | POA: Diagnosis present

## 2014-10-17 DIAGNOSIS — Z86711 Personal history of pulmonary embolism: Secondary | ICD-10-CM | POA: Diagnosis not present

## 2014-10-17 DIAGNOSIS — F332 Major depressive disorder, recurrent severe without psychotic features: Secondary | ICD-10-CM | POA: Diagnosis present

## 2014-10-17 DIAGNOSIS — F17200 Nicotine dependence, unspecified, uncomplicated: Secondary | ICD-10-CM | POA: Diagnosis present

## 2014-10-17 MED ORDER — ASPIRIN EC 81 MG PO TBEC
81.0000 mg | DELAYED_RELEASE_TABLET | Freq: Every day | ORAL | Status: DC
  Administered 2014-10-17 – 2014-10-21 (×5): 81 mg via ORAL
  Filled 2014-10-17 (×8): qty 1

## 2014-10-17 MED ORDER — HYDROXYZINE HCL 25 MG PO TABS
25.0000 mg | ORAL_TABLET | Freq: Four times a day (QID) | ORAL | Status: AC | PRN
  Administered 2014-10-18 – 2014-10-19 (×3): 25 mg via ORAL
  Filled 2014-10-17 (×4): qty 1

## 2014-10-17 MED ORDER — POTASSIUM CHLORIDE CRYS ER 20 MEQ PO TBCR
20.0000 meq | EXTENDED_RELEASE_TABLET | Freq: Two times a day (BID) | ORAL | Status: AC
  Administered 2014-10-17 – 2014-10-18 (×4): 20 meq via ORAL
  Filled 2014-10-17 (×5): qty 1

## 2014-10-17 MED ORDER — TRAZODONE HCL 50 MG PO TABS
50.0000 mg | ORAL_TABLET | Freq: Every evening | ORAL | Status: DC | PRN
  Administered 2014-10-17 – 2014-10-20 (×4): 50 mg via ORAL
  Filled 2014-10-17 (×15): qty 1

## 2014-10-17 MED ORDER — ALBUTEROL SULFATE HFA 108 (90 BASE) MCG/ACT IN AERS
1.0000 | INHALATION_SPRAY | Freq: Four times a day (QID) | RESPIRATORY_TRACT | Status: DC | PRN
  Administered 2014-10-17 – 2014-10-19 (×3): 2 via RESPIRATORY_TRACT
  Filled 2014-10-17: qty 6.7

## 2014-10-17 MED ORDER — THIAMINE HCL 100 MG/ML IJ SOLN: 100.0000 mg | Freq: Once | INTRAMUSCULAR | Status: DC

## 2014-10-17 MED ORDER — VENLAFAXINE HCL ER 37.5 MG PO CP24: 37.5000 mg | ORAL_CAPSULE | Freq: Every day | ORAL | Status: DC

## 2014-10-17 MED ORDER — LOPERAMIDE HCL 2 MG PO CAPS: 2.0000 mg | ORAL_CAPSULE | ORAL | Status: AC | PRN

## 2014-10-17 MED ORDER — LORAZEPAM 1 MG PO TABS
1.0000 mg | ORAL_TABLET | Freq: Two times a day (BID) | ORAL | Status: AC
  Administered 2014-10-19 (×2): 1 mg via ORAL
  Filled 2014-10-17 (×2): qty 1

## 2014-10-17 MED ORDER — SERTRALINE HCL 25 MG PO TABS
25.0000 mg | ORAL_TABLET | Freq: Every day | ORAL | Status: DC
  Administered 2014-10-17: 25 mg via ORAL
  Filled 2014-10-17 (×3): qty 1

## 2014-10-17 MED ORDER — BOOST / RESOURCE BREEZE PO LIQD
1.0000 | Freq: Two times a day (BID) | ORAL | Status: DC
  Administered 2014-10-17: 1 via ORAL
  Filled 2014-10-17 (×13): qty 1

## 2014-10-17 MED ORDER — VENLAFAXINE HCL ER 37.5 MG PO CP24
37.5000 mg | ORAL_CAPSULE | Freq: Every day | ORAL | Status: DC
  Administered 2014-10-17 – 2014-10-18 (×2): 37.5 mg via ORAL
  Filled 2014-10-17 (×4): qty 1

## 2014-10-17 MED ORDER — ADULT MULTIVITAMIN W/MINERALS CH
1.0000 | ORAL_TABLET | Freq: Every day | ORAL | Status: DC
  Administered 2014-10-17 – 2014-10-21 (×5): 1 via ORAL
  Filled 2014-10-17 (×8): qty 1

## 2014-10-17 MED ORDER — VITAMIN B-1 100 MG PO TABS
100.0000 mg | ORAL_TABLET | Freq: Every day | ORAL | Status: DC
  Administered 2014-10-18 – 2014-10-21 (×4): 100 mg via ORAL
  Filled 2014-10-17 (×6): qty 1

## 2014-10-17 MED ORDER — LORAZEPAM 1 MG PO TABS
1.0000 mg | ORAL_TABLET | Freq: Every day | ORAL | Status: AC
  Administered 2014-10-20: 1 mg via ORAL
  Filled 2014-10-17: qty 1

## 2014-10-17 MED ORDER — NICOTINE 21 MG/24HR TD PT24
MEDICATED_PATCH | TRANSDERMAL | Status: AC
  Administered 2014-10-17: 21 mg
  Filled 2014-10-17: qty 1

## 2014-10-17 MED ORDER — LORAZEPAM 1 MG PO TABS
1.0000 mg | ORAL_TABLET | Freq: Four times a day (QID) | ORAL | Status: AC
  Administered 2014-10-17 (×4): 1 mg via ORAL
  Filled 2014-10-17 (×4): qty 1

## 2014-10-17 MED ORDER — NICOTINE 21 MG/24HR TD PT24
21.0000 mg | MEDICATED_PATCH | Freq: Every day | TRANSDERMAL | Status: DC
  Administered 2014-10-18 – 2014-10-21 (×4): 21 mg via TRANSDERMAL
  Filled 2014-10-17 (×7): qty 1

## 2014-10-17 MED ORDER — ADULT MULTIVITAMIN LIQUID CH
5.0000 mL | Freq: Every day | ORAL | Status: DC
  Filled 2014-10-17: qty 5

## 2014-10-17 MED ORDER — ENSURE ENLIVE PO LIQD
237.0000 mL | Freq: Two times a day (BID) | ORAL | Status: DC
  Administered 2014-10-17 – 2014-10-21 (×7): 237 mL via ORAL

## 2014-10-17 MED ORDER — LORAZEPAM 1 MG PO TABS
1.0000 mg | ORAL_TABLET | Freq: Four times a day (QID) | ORAL | Status: AC | PRN
  Administered 2014-10-17 – 2014-10-19 (×2): 1 mg via ORAL
  Filled 2014-10-17 (×2): qty 1

## 2014-10-17 MED ORDER — ONDANSETRON 4 MG PO TBDP
4.0000 mg | ORAL_TABLET | Freq: Four times a day (QID) | ORAL | Status: AC | PRN
  Administered 2014-10-19: 4 mg via ORAL
  Filled 2014-10-17: qty 1

## 2014-10-17 MED ORDER — LORAZEPAM 1 MG PO TABS
1.0000 mg | ORAL_TABLET | Freq: Three times a day (TID) | ORAL | Status: AC
  Administered 2014-10-18 (×3): 1 mg via ORAL
  Filled 2014-10-17 (×3): qty 1

## 2014-10-17 MED ORDER — LORATADINE 10 MG PO TABS
10.0000 mg | ORAL_TABLET | Freq: Every day | ORAL | Status: DC
  Administered 2014-10-17 – 2014-10-21 (×5): 10 mg via ORAL
  Filled 2014-10-17 (×9): qty 1

## 2014-10-17 NOTE — Progress Notes (Signed)
NUTRITION ASSESSMENT  Pt identified as at risk on the Malnutrition Screen Tool  INTERVENTION: 1. Educated patient on the importance of nutrition and encouraged intake of food and beverages. 2. Discussed weight goals. 3. Supplements: will order Resource Breeze po BID, each supplement provides 250 kcal and 9 grams of protein   NUTRITION DIAGNOSIS: Unintentional weight loss related to sub-optimal intake as evidenced by pt report.   Goal: Pt to meet >/= 90% of their estimated nutrition needs.  Monitor:  PO intake  Assessment:  Pt seen for MST. Pt reports lack of appetite this AM and that he does not like the taste of the food. He reports poor appetite was going on for several weeks PTA due to depression and suicidal ideation. He states that now and PTA he was experiencing slight abdominal pain that was not exacerbated by eating. Pt reports that he would force himself to eat most days knowing that he needed the calories. He would typically eat 1 meal/day but sometimes go 2-3 days without eating anything. Pt reports UBW of 185 lbs and that he has lost weight in the past month. Per chart review, this indicates a 29 lb weight loss (16% body weight) in 1 month.  Pt not meeting needs. Will order Resource Breeze to supplement. Labs and medications reviewed.  29 y.o. male  Height: Ht Readings from Last 1 Encounters:  10/17/14 6\' 1"  (1.854 m)    Weight: Wt Readings from Last 1 Encounters:  10/17/14 156 lb (70.761 kg)    Weight Hx: Wt Readings from Last 10 Encounters:  10/17/14 156 lb (70.761 kg)  10/13/14 165 lb (74.844 kg)    BMI:  Body mass index is 20.59 kg/(m^2). Pt meets criteria for normal weight status based on current BMI.  Estimated Nutritional Needs: Kcal: 25-30 kcal/kg Protein: > 1 gram protein/kg Fluid: 1 ml/kcal  Diet Order: No order entered at this time, but pt states he did have breakfast Pt is also offered choice of unit snacks mid-morning and mid-afternoon.  Pt  is eating as desired.   Lab results and medications reviewed.    Trenton GammonJessica Avelino Herren, RD, LDN Inpatient Clinical Dietitian Pager # [phone] After hours/weekend pager # [phone]

## 2014-10-17 NOTE — [facility] Suicide Risk Assessment (Signed)
[facility]   Nursing information obtained from:  Patient Demographic factors:  Male, Adolescent or young adult Current Mental Status:  Suicidal ideation indicated by patient Loss Factors:  Loss of significant relationship, NA Historical Factors:  Prior suicide attempts, Family history of mental illness or substance abuse Risk Reduction Factors:  Employed, Positive social support Total Time spent with patient: 45 minutes Principal Problem: Major depressive disorder, recurrent episode, severe Diagnosis:   Patient Active Problem List   Diagnosis Date Noted  . Major depressive disorder, recurrent episode, severe [F33.2] 10/17/2014  . Major depressive disorder, recurrent severe without psychotic features [F33.2] 10/16/2014     Continued Clinical Symptoms:  Alcohol Use Disorder Identification Test Final Score (AUDIT): 10 The "Alcohol Use Disorders Identification Test", Guidelines for Use in Primary Care, Second Edition.  World Science writerHealth Organization [facility](WHO). Score between 0-7:  no or low risk or alcohol related problems. Score between 8-15:  moderate risk of alcohol related problems. Score between 16-19:  high risk of alcohol related problems. Score 20 or above:  warrants further diagnostic evaluation for alcohol dependence and treatment.   CLINICAL FACTORS:   Depression:   Severe  Psychiatric Specialty Exam: Physical Exam  ROS  Blood pressure 102/60, pulse 89, temperature 98 F (36.7 C), temperature source Oral, resp. rate 16, height 6\' 1"  (1.854 m), weight 70.761 kg (156 lb), SpO2 98 %.Body mass index is 20.59 kg/(m^2).    COGNITIVE FEATURES THAT CONTRIBUTE TO RISK:  Closed-mindedness, Polarized thinking and Thought constriction (tunnel vision)    SUICIDE RISK:   Moderate:  Frequent suicidal ideation with limited intensity, and duration, some specificity in terms of plans, no associated intent, good self-control, limited dysphoria/symptomatology, some risk  factors present, and identifiable protective factors, including available and accessible social support.  PLAN OF CARE: Supportive approach/coping skills                               Depression; start Effexor work with CBT/mindfulness                               Reassess role of his substance abuse in the worsening of the depression Medical Decision Making:  Review of Psycho-Social Stressors (1), Review or order clinical lab tests (1), Review of Medication Regimen & Side Effects (2) and Review of New Medication or Change in Dosage (2)  I certify that inpatient services furnished can reasonably be expected to improve the patient's condition.   Mailyn Steichen A 10/17/2014, 6:02 PM

## 2014-10-17 NOTE — Progress Notes (Signed)
D:Patient in his room in bed awake o approach.  Patient states he is here so he can get on mediations to help his anxiety and depression.  Patient cooperative during admission.  Patient states it makes him anxious to sit in his room and he states he is bored.  Patient encouraged to become more visible on the unit and to interact with peers.  Patient denies SI/HI and denies AVH.     A: Staff to monitor Q 15 mins for safety.  Encouragement and support offered.  Scheduled medications administered per orders. R: Patient remains safe on the unit.  Patient attended group tonight.  Patient visible on the unit and interacting with peers.  Patient taking administered medications.
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
## 2014-10-17 NOTE — BHH Group Notes (Signed)
B[facility] Group Notes:  (Nursing/MHT/Case Management/Adjunct)  Date:  10/17/2014  Time:  0900  Type of Therapy:  Nurse Education  Participation Level:  Did Not Attend  Participation Quality:    Affect:    Cognitive:    Insight:    Engagement in Group:    Modes of Intervention:    Summary of Progress/Problems: Patient was invited to group however elected to stay in bed. Patient very flat and depressed.  Merian CapronFriedman, [place] Elizondo [facility] 10/17/2014, 930am
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
## 2014-10-17 NOTE — [facility] Group Notes (Signed)
[facility] LCSW Group Therapy  10/17/2014 1:25 PM  Type of Therapy:  Group Therapy  Participation Level:  Active  Participation Quality:  Attentive  Affect:  Appropriate  Cognitive:  Alert and Oriented  Insight:  Engaged  Engagement in Therapy:  Engaged  Modes of Intervention:  Discussion, Education, Exploration, Problem-solving, Rapport Building, Socialization and Support  Summary of Progress/Problems: MHA Speaker came to talk about his personal journey with substance abuse and addiction. The pt processed ways by which to relate to the speaker. MHA speaker provided handouts and educational information pertaining to groups and services [facility] by [facility].   Smart, Arlinda Barcelona LCSWA 10/17/2014, 1:25 PM
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
## 2014-10-17 NOTE — ED Notes (Signed)
Pt transported to [facility] by [facility] transportation service for continuation of specialized care. Belongings given to driver after patient signed for them. Pt left in no acute distress. 

## 2014-10-17 NOTE — Progress Notes (Signed)
Patient resting in bed, isolative. States, "I feel awful this morning. So down and depressed. I didn't sleep at all." Affect flat, mood depressed. (Refused completion of self inventory thus far.) Patient offered support, reassurance. Encouraged to attend programming. Medicated per orders. Discussed patient's status with MD, treatment team. Denies pain or physical problems. Denies SI/HI/AVH and remains safe. Lawrence MarseillesFriedman, Narelle Schoening Eakes

## 2014-10-17 NOTE — Tx Team (Signed)
Initial Interdisciplinary Treatment Plan   PATIENT STRESSORS: Financial difficulties Marital or family conflict Substance abuse   PATIENT STRENGTHS: Ability for insight Capable of independent living Motivation for treatment/growth Supportive family/friends Work skills   PROBLEM LIST: Problem List/Patient Goals Date to be addressed Date deferred Reason deferred Estimated date of resolution  Alcohol Abuse      Suicide thoughts " I have been having those thought but no plan"                                                 DISCHARGE CRITERIA:  Adequate post-discharge living arrangements Improved stabilization in mood, thinking, and/or behavior Motivation to continue treatment in a less acute level of care Verbal commitment to aftercare and medication compliance Withdrawal symptoms are absent or subacute and managed without 24-hour nursing intervention  PRELIMINARY DISCHARGE PLAN: Attend aftercare/continuing care group Attend PHP/IOP Return to previous living arrangement Return to previous work or school arrangements  PATIENT/FAMIILY INVOLVEMENT: This treatment plan has been presented to and reviewed with the patient, Jamie Hood, and/or family member.  The patient and family have been given the opportunity to ask questions and make suggestions.  [name], [name] [name] 10/17/2014, 3:23 AM
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
## 2014-10-17 NOTE — Plan of Care (Signed)
Problem: Alteration in mood & ability to function due to Goal: STG-Patient will report withdrawal symptoms Outcome: Progressing Patient denying withdrawal. Goal: STG-Patient will comply with prescribed medication regimen (Patient will comply with prescribed medication regimen)  Outcome: Progressing Patient is med compliant.

## 2014-10-17 NOTE — Progress Notes (Signed)
Recreation Therapy Notes  Animal-Assisted Activity (AAA) Program Checklist/Progress Notes Patient Eligibility Criteria Checklist & Daily Group note for Rec Tx Intervention  Date: 05.10.2016 Time: 2:45pm Location: [address] Dayroom    AAA/T Program Assumption of Risk Form signed by Patient/ or Parent Legal Guardian yes  Patient is free of allergies or sever asthma yes  Patient reports no fear of animals yes  Patient reports no history of cruelty to animals yes  Patient understands his/her participation is voluntary yes  Behavioral Response: Did not attend.   Seriyah Collison L Estel Tonelli, LRT/CTRS  Makia Bossi L 10/17/2014 4:08 PM 

## 2014-10-18 MED ORDER — ARIPIPRAZOLE 2 MG PO TABS
2.0000 mg | ORAL_TABLET | Freq: Every day | ORAL | Status: DC
  Administered 2014-10-18 – 2014-10-20 (×3): 2 mg via ORAL
  Filled 2014-10-18 (×6): qty 1

## 2014-10-18 MED ORDER — VENLAFAXINE HCL ER 75 MG PO CP24
75.0000 mg | ORAL_CAPSULE | Freq: Every day | ORAL | Status: DC
  Administered 2014-10-19 – 2014-10-21 (×3): 75 mg via ORAL
  Filled 2014-10-18: qty 3
  Filled 2014-10-18 (×4): qty 1
  Filled 2014-10-18: qty 3
  Filled 2014-10-18: qty 1

## 2014-10-18 NOTE — Progress Notes (Signed)
[facility]  10/18/2014 6:36 PM Jamie RicksBradley Hood  MRN:  [id number] Subjective:  Jamie RadonBradley endorses feeling more depressed today. States that he does go trough mood swings not triggered and not related to substance use. He has history of depression starting early on, suicidal attempts, seasonal component of his depression with episodes of increased energy and motivation excitement not necessarily triggered by outside event. He has family history of a grandmother called " schizophrenic" but who could also be Bipolar Principal Problem: Major depressive disorder, recurrent episode, severe Diagnosis:   Patient Active Problem List   Diagnosis Date Noted  . Major depressive disorder, recurrent episode, severe [F33.2] 10/17/2014  . Major depressive disorder, recurrent severe without psychotic features [F33.2] 10/16/2014   Total Time spent with patient: 30 minutes   Past Medical History:  Past Medical History  Diagnosis Date  . Asthma   . PE (pulmonary embolism)     Past Surgical History  Procedure Laterality Date  . Hernia repair    . Incision and drainage wound with foreign body removal     Family History: History reviewed. No pertinent family history. Social History:  History  Alcohol Use  . Yes    Comment: social     History  Drug Use No    History   Social History  . Marital Status: Single    Spouse Name: N/A  . Number of Children: N/A  . Years of Education: N/A   Social History Main Topics  . Smoking status: Never Smoker   . Smokeless tobacco: Not on file  . Alcohol Use: Yes     Comment: social  . Drug Use: No  . Sexual Activity: Not on file   Other Topics Concern  . None   Social History Narrative   Additional History:    Sleep: Fair  Appetite:  Poor   Assessment:   Musculoskeletal: Strength & Muscle Tone: within normal limits Gait & Station: normal Patient leans: N/A   Psychiatric Specialty Exam: Physical Exam  Review of Systems   Constitutional: Negative.   HENT: Negative.   Eyes: Negative.   Respiratory: Negative.   Cardiovascular: Negative.   Gastrointestinal: Negative.   Genitourinary: Negative.   Musculoskeletal: Negative.   Skin: Negative.   Neurological: Negative.   Endo/Heme/Allergies: Negative.   Psychiatric/Behavioral: Positive for depression and suicidal ideas. The patient is nervous/anxious.     Blood pressure 134/72, pulse 102, temperature 98.8 F (37.1 C), temperature source Oral, resp. rate 16, height 6\' 1"  (1.854 m), weight 70.761 kg (156 lb), SpO2 92 %.Body mass index is 20.59 kg/(m^2).  General Appearance: Fairly Groomed  Patent attorneyye Contact::  Fair  Speech:  Clear and Coherent and not spontaneous  Volume:  Decreased  Mood:  Depressed  Affect:  Depressed and Restricted  Thought Process:  Coherent and Goal Directed  Orientation:  Full (Time, Place, and Person)  Thought Content:  sympomptoms events worries concerns "I just want to feel normal"  Suicidal Thoughts:  yes no plans or intent  Homicidal Thoughts:  No  Memory:  Immediate;   Fair Recent;   Fair Remote;   Fair  Judgement:  Fair  Insight:  Present  Psychomotor Activity:  Decreased  Concentration:  Fair  Recall:  FiservFair  Fund of Knowledge:Fair  Language: Fair  Akathisia:  No  Handed:  Right  AIMS (if indicated):     Assets:  Desire for Improvement Housing Social Support Vocational/Educational  ADL's:  Intact  Cognition: WNL  Sleep:  Current Medications: Current Facility-Administered Medications  Medication Dose Route Frequency Provider Last Rate Last Dose  . albuterol (PROVENTIL HFA;VENTOLIN HFA) 108 (90 BASE) MCG/ACT inhaler 1-2 puff  1-2 puff Inhalation Q6H PRN Earney Navy, NP   2 puff at 10/18/14 1214  . ARIPiprazole (ABILIFY) tablet 2 mg  2 mg Oral Daily Rachael Fee, MD   2 mg at 10/18/14 1539  . aspirin EC tablet 81 mg  81 mg Oral Daily Earney Navy, NP   81 mg at 10/18/14 0855  . feeding supplement  (ENSURE ENLIVE) (ENSURE ENLIVE) liquid 237 mL  237 mL Oral BID BM Mena Goes Simon, PA-C   237 mL at 10/18/14 1419  . feeding supplement (RESOURCE BREEZE) (RESOURCE BREEZE) liquid 1 Container  1 Container Oral BID BM Renie Ora, RD   1 Container at 10/17/14 1021  . hydrOXYzine (ATARAX/VISTARIL) tablet 25 mg  25 mg Oral Q6H PRN Kerry Hough, PA-C   25 mg at 10/18/14 1422  . loperamide (IMODIUM) capsule 2-4 mg  2-4 mg Oral PRN Kerry Hough, PA-C      . loratadine (CLARITIN) tablet 10 mg  10 mg Oral Daily Earney Navy, NP   10 mg at 10/18/14 0855  . LORazepam (ATIVAN) tablet 1 mg  1 mg Oral Q6H PRN Kerry Hough, PA-C   1 mg at 10/17/14 0331  . [START ON 10/19/2014] LORazepam (ATIVAN) tablet 1 mg  1 mg Oral BID Kerry Hough, PA-C       Followed by  . [START ON 10/20/2014] LORazepam (ATIVAN) tablet 1 mg  1 mg Oral Daily Kerry Hough, PA-C      . multivitamin with minerals tablet 1 tablet  1 tablet Oral Daily Kerry Hough, PA-C   1 tablet at 10/18/14 0854  . nicotine (NICODERM CQ - dosed in mg/24 hours) patch 21 mg  21 mg Transdermal Daily Rachael Fee, MD   21 mg at 10/18/14 0855  . ondansetron (ZOFRAN-ODT) disintegrating tablet 4 mg  4 mg Oral Q6H PRN Kerry Hough, PA-C      . thiamine (B-1) injection 100 mg  100 mg Intramuscular Once Kerry Hough, PA-C   100 mg at 10/17/14 0330  . thiamine (VITAMIN B-1) tablet 100 mg  100 mg Oral Daily Kerry Hough, PA-C   100 mg at 10/18/14 0855  . traZODone (DESYREL) tablet 50 mg  50 mg Oral QHS,MR X 1 Spencer E Simon, PA-C   50 mg at 10/17/14 2200  . venlafaxine XR (EFFEXOR-XR) 24 hr capsule 37.5 mg  37.5 mg Oral Q breakfast Rachael Fee, MD   37.5 mg at 10/18/14 1610    Lab Results: No results found for this or any previous visit (from the past 48 hour(s)).  Physical Findings: AIMS: Facial and Oral Movements Muscles of Facial Expression: None, normal Lips and Perioral Area: None, normal Jaw: None, normal Tongue: None,  normal,Extremity Movements Upper (arms, wrists, hands, fingers): None, normal Lower (legs, knees, ankles, toes): None, normal, Trunk Movements Neck, shoulders, hips: None, normal, Overall Severity Severity of abnormal movements (highest score from questions above): None, normal Incapacitation due to abnormal movements: None, normal Patient's awareness of abnormal movements (rate only patient's report): No Awareness, Dental Status Current problems with teeth and/or dentures?: No Does patient usually wear dentures?: No  CIWA:  CIWA-Ar Total: 1 COWS:     Treatment Plan Summary: Daily contact with patient to assess and evaluate symptoms  and progress in treatment and Medication management Supportive approach/coping skills Alcohol Abuse; continue to detox with Ativan/work a relapse prevention plan Mood instability; will add Abilify 2 mg with plans to increase to 5 mg as tolerated Depression; will increase the Effexor to 75 mg watching for any possible mood fluctuations that would suggest bipolar depression CBT/mindfulness Medical Decision Making:  Review of Psycho-Social Stressors (1), Review of Medication Regimen & Side Effects (2) and Review of New Medication or Change in Dosage (2)     Dior Dominik A 10/18/2014, 6:36 PM

## 2014-10-18 NOTE — [facility] Group Notes (Signed)
[facility] LCSW Group Therapy  10/18/2014 1:21 PM  Type of Therapy:  Group Therapy  Participation Level:  Minimal  Participation Quality:  Appropriate  Affect:  Depressed and Flat  Cognitive:  Lacking  Insight:  Limited  Engagement in Therapy:  Limited   Modes of Intervention:  Confrontation, Discussion, Education, Exploration, Problem-solving, Rapport Building, Socialization and Support  Summary of Progress/Problems: Today's Topic: Overcoming Obstacles. Patients identified one short term goal and potential obstacles in reaching this goal. Patients processed barriers involved in overcoming these obstacles. Patients identified steps necessary for overcoming these obstacles and explored motivation (internal and external) for facing these difficulties head on. Jamie Hood was attentive during today's processing group but minimally participated in active discussion. Jamie Hood shared that he is worried about not getting his mental illness symptoms managed before d/cing and returning to work. Jamie Hood was open to learning about the psych IOP classes offered in outpatient and discussed with CSW the need for a psychiatrist and follow-up with his therapist. Pt continues to demonstrate improving, but limited insight.   Smart, [place]ace Dowd LCSWA 10/18/2014, 1:21 PM

## 2014-10-18 NOTE — Progress Notes (Signed)
D: Patient presents with depressed affect and mood. He reported on the self inventory sheet that he's resting fair at night, poor appetite and ability to concentrate and low energy level. Patient rates depression "7", feelings of hopelessness "3" and anxiety "6". He expressed this evening that he was sad to have learned that he's not discharging home until Monday. Writer observed that the patient has been lying in bed the majority of the day. Patient is compliant with medications and tolerating them well.  A: Support and encouragement provided to patient. Scheduled medications given per MD orders. Maintain Q15 minute checks for safety.  R: Patient receptive. Denies SI/HI and AVH. Patient remains safe on the hall.

## 2014-10-18 NOTE — [facility] Group Notes (Signed)
[facility]   10/18/2014 9:24 AM  Participation Quality:  Minimal   Mood/Affect:  Depressed and Flat  Depression Rating:  6  Anxiety Rating:  6  Thoughts of Suicide:  No Will you contract for safety?   NA  Current AVH:  No  Plan for Discharge/Comments:  Pt reports that he is feeling "okay" today. Pt appears flat and depressed. Reports that his mother came to see him last night. Pt minimally responsive when being asked questions. Sleep was "fine." no withdrawals reported.   Transportation Means: bus/family member   Supports: mother/limited support from his fiance.   Smart, American FinancialHeather LCSWA

## 2014-10-18 NOTE — Progress Notes (Signed)
Patient in his room with his mother at the beginning of this shift. Mood and affect bright and appropriate. His mother bradding his hair for him during the visit. Patient requested for his car key to be given to his mother, so she could [facility]. Writer asked the security to open the car and removed the keys from his locker and gave the keys to the patient. He signed off the keys on the belonging form. Writer encouraged patient to attend group. Q 15 minute check continues as ordered to maintain safety.

## 2014-10-18 NOTE — Progress Notes (Signed)
Recreation Therapy Notes   Date: 10/18/14 Time: 9:30am Location: [address] Group Room  Group Topic: Stress Management  Goal Area(s) Addresses:  Patient will actively participate in stress management techniques presented during session.   Behavioral Response:  Did not attend.    Deija Buhrman, LRT/CTRS        Madiline Saffran A 10/18/2014 3:17 PM 
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
## 2014-10-19 NOTE — Progress Notes (Signed)
D    Pt brightened on approach this evening and said he enjoyed the karaoke group and his mood appears to have elevated since going to group  He reports sleep disturbance and received additional medications to help him sleep tonight   He reports passive si but contracts for safety    A   Verbal support given   Medications administered and effectiveness monitored   Q 15 min checks   Discussed sleep disturbance and medications which would help also told pt to inform doctor if he doesn't sleep tonight R   Pt safe at present and verbalizes understanding

## 2014-10-19 NOTE — [facility] Group Notes (Signed)
[facility] LCSW Group Therapy  10/19/2014 3:17 PM  Type of Therapy:  Group Therapy  Participation Level:  Active  Participation Quality:  Attentive  Affect:  Appropriate  Cognitive:  Alert and Oriented  Insight:  Improving  Engagement in Therapy:  Improving  Modes of Intervention:  Confrontation, Discussion, Education, Exploration, Problem-solving, Rapport Building, Socialization and Support  Summary of Progress/Problems:  Finding Balance in Life. Today's group focused on defining balance in one's own words, identifying things that can knock one off balance, and exploring healthy ways to maintain balance in life. Group members were asked to provide an example of a time when they felt off balance, describe how they handled that situation,and process healthier ways to regain balance in the future. Group members were asked to share the most important tool for maintaining balance that they [facility] and how they plan to apply this method after discharge. Elige RadonBradley was attentive and engaged during group. He shared that his family hx of mental illness, his current relationship issues, and his mental health deterioration have [facility]. Pt is interested in more intensive followup and talked with CSW about psych IOP in addition to therapy and med management. Elige RadonBradley show some progress in the group setting and improving insight. "I need to think about my options and my relationship. " Elige RadonBradley was able to identify his current stressors, but struggled to find ways to effectively cope. Elige RadonBradley was open to suggestions from the group.    Smart, Lawernce Earll LCSWA  10/19/2014, 3:17 PM

## 2014-10-19 NOTE — Progress Notes (Signed)
D: Pt's mood is depressed. Eye contact is fair. Pt endorses passive SI but contracts for safety. Pt states "If I really wanted to kill myself then I would go out with a bang." States that he doesn't intend to hurt anyone but "there are always casualties in war".  A: Support given. Verbalization encouraged. Pt encouraged to come to staff for any concerns. Medications given as prescribed. R: Pt is receptive. No complaints of pain or discomfort at this time. Q15 min safety checks maintained. Pt remains safe on the unit. Will continue to monitor.

## 2014-10-19 NOTE — Progress Notes (Signed)
Patient did attend the evening karaoke group. Pt was engaged, supportive and participated by singing a song. Pt was a little distracting with conversation but was redirectable and pt was conversing about the music.

## 2014-10-20 MED ORDER — ZOLPIDEM TARTRATE 10 MG PO TABS
10.0000 mg | ORAL_TABLET | Freq: Every evening | ORAL | Status: DC | PRN
  Administered 2014-10-20: 10 mg via ORAL
  Filled 2014-10-20 (×2): qty 1

## 2014-10-20 MED ORDER — ARIPIPRAZOLE 2 MG PO TABS
2.0000 mg | ORAL_TABLET | Freq: Once | ORAL | Status: AC
  Administered 2014-10-20: 2 mg via ORAL
  Filled 2014-10-20 (×2): qty 1

## 2014-10-20 MED ORDER — ARIPIPRAZOLE 5 MG PO TABS
5.0000 mg | ORAL_TABLET | Freq: Every day | ORAL | Status: DC
  Administered 2014-10-21: 5 mg via ORAL
  Filled 2014-10-20: qty 1
  Filled 2014-10-20 (×2): qty 5
  Filled 2014-10-20: qty 1

## 2014-10-20 NOTE — Progress Notes (Signed)
Pt attended AA and was appropriate and attentative 

## 2014-10-20 NOTE — [facility] Group Notes (Signed)
[facility] Note   10/20/2014 9:43 AM  Participation Quality:  Appropriate   Mood/Affect:  Appropriate  Depression Rating:  6  Anxiety Rating:  7  Thoughts of Suicide:  No Will you contract for safety?   NA  Current AVH:  No  Plan for Discharge/Comments:  Pt reports that he is ready to discharge "maybe Sunday?" and face his problems "on the outside." He plans to follow-up with his therapist [name], [facility], and verbalized understanding of instructions to contact [name] to schedule assessment for Psych IOP at d/c. (per Rita's instructions).    Transportation Means: mother  Supports: mother  Smart, [name] LCSWA

## 2014-10-20 NOTE — [facility] Group Notes (Signed)
[facility] LCSW Aftercare Discharge [facility] Group Note   10/20/2014 11:55 AM  Participation Quality:  Appropriate   Mood/Affect:  Appropriate  Depression Rating:  6  Anxiety Rating:  7  Thoughts of Suicide:  No Will you contract for safety?   NA  Current AVH:  No  Plan for Discharge/Comments:  Pt reports that he is ready to discharge "maybe Sunday?" and face his problems "on the outside." He plans to follow-up with his therapist [name], [facility], and verbalized understanding of instructions to contact [name] to schedule assessment for Psych IOP at d/c. (per Rita's instructions).    Transportation Means: mother  Supports: mother  Smart, [name] LCSWA
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
## 2014-10-20 NOTE — Progress Notes (Signed)
Recreation Therapy Notes  Date: 10/19/14 Time: 2:30pm [facility]: [address]   AAA/T Program Assumption of Risk Form signed by Patient/ or Parent Legal Guardian YES   Patient is free of allergies or sever asthma YES   Patient reports no fear of animals YES  Patient reports no history of cruelty to animals YES  Patient understands his/her participation is voluntary YES  Patient washes hands before animal contact YES  Patient washes hands after animal contact YES  Clinical Observations/Feedback: Patient did not attend.   Caroll RancherMarjette Sena Clouatre, LRT/CTRS         Caroll RancherLindsay, Layia Walla A 10/20/2014 8:41 AM
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
## 2014-10-20 NOTE — Progress Notes (Signed)
Recreation Therapy Notes  Date: 10/20/14 Time: 9:30am Location: [address] Group Room  Group Topic: Stress Management  Goal Area(s) Addresses:  Patient will actively participate in stress management techniques presented during session.   Intervention: Stress management techniques  Activity: Guided Imagery. LRT provided instruction and demonstration for Guided Imagery.   Education: Stress Management, Discharge Planning.   Clinical Observations/Feedback: Patient did not attend group.   Veena Sturgess, LRT/CTRS         Nickalas Mccarrick A 10/20/2014 3:42 PM 

## 2014-10-20 NOTE — [facility] Group Notes (Signed)
[facility] LCSW Group Therapy  10/20/2014 1:18 PM  Type of Therapy:  Group Therapy  Participation Level:  Active  Participation Quality:  Attentive  Affect:  Appropriate  Cognitive:  Alert and Oriented  Insight:  Improving  Engagement in Therapy:  Engaged  Modes of Intervention:  Confrontation, Discussion, Education, Exploration, Problem-solving, Rapport Building, Socialization and Support  Summary of Progress/Problems: Feelings around Relapse. Group members discussed the meaning of relapse and shared personal stories of relapse, how it affected them and others, and how they perceived themselves during this time. Group members were encouraged to identify triggers, warning signs and coping skills used when facing the possibility of relapse. Social supports were discussed and explored in detail. Post Acute Withdrawal Syndrome (handout provided) was introduced and examined. Pt's were encouraged to ask questions, talk about key points associated with PAWS, and process this information in terms of relapse prevention. Jamie Hood was attentive and engaged during today's processing group. He talked about his mental illness relapse and how MI runs in his family. "I have a crazy relationship situation too, and that adds to my stress." Jamie Hood talked about his need for "more intensive therapy" but is conflicted because [place]straighten things out with my fiance." He continues to demonstrate limited insight with improvements in the group setting-more open and willing to participate in active discussion.   Smart, Jamie Hood LCSWA 10/20/2014, 1:18 PM

## 2014-10-20 NOTE — Progress Notes (Addendum)
  [facility] Adult [facility] Discharge Plan :  Will you be returning to the same living situation after discharge:  Yes,  home At discharge, do you have transportation home?: Yes,  mother. PT SCHEDULED FOR DISCHARGE ON SAT 10/21/14 PER DR. Dub MikesLUGO  Do you have the ability to pay for your medications: Yes,  [employer]/private insurance  Release of information consent forms completed and submitted to Medical Records by CSW.  Patient to Follow up at: Follow-up Information    [facility] with [facility]   Why:  Call  Jamie Hood at [phone] at discharge to schedule assessment for Psych IOP program (per [place]).    Contact information:   [address] [place], [place] [address] Phone: [phone] Fax: [phone]      [facility]   Why:  Social worker left message for Jamie Hood on 10/19/14. He is out of the office until Tuesday 10/24/14. Please call [name] on 10/24/14 to schedule therapy appt. Thank you.   Contact information:   ATTN: Jamie BoltErnest Hood [address] [place], [place] [address] Phone: [phone] Fax: [phone]      Follow up with [facility] On 11/02/2014.   Why:  Appt on this date at 11:00AM for medication management/hospital follow-up.    Contact information:   [address] [place], [place] [address] Phone: [phone] Fax: [phone]      Patient denies SI/HI: Yes,  during group/self report.     Safety Planning and Suicide Prevention discussed: Yes,  SPE completed with pt. Contact attempts made with pt's mother. SPI pamphlet provided to pt and he was encouraged to share information with support network, ask questions, and talk about any concerns relating to SPE.   Have you used any form of tobacco in the last 30 days? (Cigarettes, Smokeless Tobacco, Cigars, and/or Pipes): Yes  Has patient been referred to the Quitline?: Patient refused referral  Smart, [name] LCSWA  10/20/2014, 12:18 PM

## 2014-10-20 NOTE — [facility] Suicide Risk Assessment (Signed)
[facility] INPATIENT:  Family/Significant Other Suicide Prevention Education  Suicide Prevention Education:  Contact Attempts: Jamie Hood (pt's mother) [phone] has been identified by the patient as the family member/significant other with whom the patient will be residing, and identified as the person(s) who will aid the patient in the event of a mental health crisis.  With written consent from the patient, two attempts were made to provide suicide prevention education, prior to and/or following the patient's discharge.  We were unsuccessful in providing suicide prevention education.  A suicide education pamphlet was given to the patient to share with family/significant other.  Date and time of first attempt: 10/19/14 at 3:15PM (line busy) Date and time of second attempt: 10/20/14 at 12:15PM (line busy)-verified contact number with pt.   Smart, Darcy Barbara LCSWA 10/20/2014, 12:17 PM

## 2014-10-20 NOTE — Progress Notes (Signed)
[facility]  10/20/2014 5:22 PM Jamie Hood  MRN:  [id number] Subjective:  Still dealing with the depression and mood instability. States his mood goes up and down without any particular cause. Does admit he is under a lot of stress coming from having to make a decision about his fiancee, how to face her if he was not to pursue marriage, returning back to work, how to interact with her when it comes to the business [place] in [place]. States at work he has to maintain his mood stable as he deals with frustrated clients who usually expressed anger and discontent and he has to be supportive. States when he is not feeling well he has a hard time being supportive of other people Principal Problem: Major depressive disorder, recurrent episode, severe Diagnosis:   Patient Active Problem List   Diagnosis Date Noted  . Major depressive disorder, recurrent episode, severe [F33.2] 10/17/2014  . Major depressive disorder, recurrent severe without psychotic features [F33.2] 10/16/2014   Total Time spent with patient: 30 minutes   Past Medical History:  Past Medical History  Diagnosis Date  . Asthma   . PE (pulmonary embolism)     Past Surgical History  Procedure Laterality Date  . Hernia repair    . Incision and drainage wound with foreign body removal     Family History: History reviewed. No pertinent family history. Social History:  History  Alcohol Use  . Yes    Comment: social     History  Drug Use No    History   Social History  . Marital Status: Single    Spouse Name: N/A  . Number of Children: N/A  . Years of Education: N/A   Social History Main Topics  . Smoking status: Never Smoker   . Smokeless tobacco: Not on file  . Alcohol Use: Yes     Comment: social  . Drug Use: No  . Sexual Activity: Not on file   Other Topics Concern  . None   Social History Narrative   Additional History:    Sleep: Fair  Appetite:  Fair   Assessment:    Musculoskeletal: Strength & Muscle Tone: within normal limits Gait & Station: normal Patient leans: normal   Psychiatric Specialty Exam: Physical Exam  Review of Systems  Constitutional: Positive for malaise/fatigue.  HENT: Negative.   Eyes: Negative.   Respiratory: Negative.   Cardiovascular: Negative.   Gastrointestinal: Negative.   Genitourinary: Negative.   Musculoskeletal: Negative.   Skin: Negative.   Neurological: Negative.   Endo/Heme/Allergies: Negative.   Psychiatric/Behavioral: Positive for depression. The patient is nervous/anxious.     Blood pressure 109/67, pulse 80, temperature 97.7 F (36.5 C), temperature source Oral, resp. rate 16, height 6\' 1"  (1.854 m), weight 70.761 kg (156 lb), SpO2 94 %.Body mass index is 20.59 kg/(m^2).  General Appearance: Fairly Groomed  Patent attorneyye Contact::  Fair  Speech:  Clear and Coherent  Volume:  Decreased  Mood:  Anxious and Depressed  Affect:  Restricted  Thought Process:  Coherent and Goal Directed  Orientation:  Full (Time, Place, and Person)  Thought Content:  symptoms events worries concerns  Suicidal Thoughts:  No  Homicidal Thoughts:  No  Memory:  Immediate;   Fair Recent;   Fair Remote;   Fair  Judgement:  Fair  Insight:  Present  Psychomotor Activity:  Restlessness  Concentration:  Fair  Recall:  FiservFair  Fund of Knowledge:Fair  Language: Fair  Akathisia:  No  Handed:  Right  AIMS (if indicated):     Assets:  Desire for Improvement  ADL's:  Intact  Cognition: WNL  Sleep:  Number of Hours: 6     Current Medications: Current Facility-Administered Medications  Medication Dose Route Frequency Provider Last Rate Last Dose  . albuterol (PROVENTIL HFA;VENTOLIN HFA) 108 (90 BASE) MCG/ACT inhaler 1-2 puff  1-2 puff Inhalation Q6H PRN Earney Navy, NP   2 puff at 10/19/14 0751  . [START ON 10/21/2014] ARIPiprazole (ABILIFY) tablet 5 mg  5 mg Oral Daily Rachael Fee, MD      . aspirin EC tablet 81 mg  81 mg  Oral Daily Earney Navy, NP   81 mg at 10/20/14 0818  . feeding supplement (ENSURE ENLIVE) (ENSURE ENLIVE) liquid 237 mL  237 mL Oral BID BM Mena Goes Simon, PA-C   237 mL at 10/20/14 1335  . feeding supplement (RESOURCE BREEZE) (RESOURCE BREEZE) liquid 1 Container  1 Container Oral BID BM Renie Ora, RD   1 Container at 10/17/14 1021  . loratadine (CLARITIN) tablet 10 mg  10 mg Oral Daily Earney Navy, NP   10 mg at 10/20/14 0818  . multivitamin with minerals tablet 1 tablet  1 tablet Oral Daily Kerry Hough, PA-C   1 tablet at 10/20/14 9147  . nicotine (NICODERM CQ - dosed in mg/24 hours) patch 21 mg  21 mg Transdermal Daily Rachael Fee, MD   21 mg at 10/20/14 0819  . thiamine (B-1) injection 100 mg  100 mg Intramuscular Once Kerry Hough, PA-C   100 mg at 10/17/14 0330  . thiamine (VITAMIN B-1) tablet 100 mg  100 mg Oral Daily Kerry Hough, PA-C   100 mg at 10/20/14 0818  . traZODone (DESYREL) tablet 50 mg  50 mg Oral QHS,MR X 1 Spencer E Simon, PA-C   50 mg at 10/19/14 2248  . venlafaxine XR (EFFEXOR-XR) 24 hr capsule 75 mg  75 mg Oral Q breakfast Rachael Fee, MD   75 mg at 10/20/14 8295  . zolpidem (AMBIEN) tablet 10 mg  10 mg Oral QHS PRN Rachael Fee, MD        Lab Results: No results found for this or any previous visit (from the past 48 hour(s)).  Physical Findings: AIMS: Facial and Oral Movements Muscles of Facial Expression: None, normal Lips and Perioral Area: None, normal Jaw: None, normal Tongue: None, normal,Extremity Movements Upper (arms, wrists, hands, fingers): None, normal Lower (legs, knees, ankles, toes): None, normal, Trunk Movements Neck, shoulders, hips: None, normal, Overall Severity Severity of abnormal movements (highest score from questions above): None, normal Incapacitation due to abnormal movements: None, normal Patient's awareness of abnormal movements (rate only patient's report): No Awareness, Dental Status Current  problems with teeth and/or dentures?: No Does patient usually wear dentures?: No  CIWA:  CIWA-Ar Total: 1 COWS:     Treatment Plan Summary: Daily contact with patient to assess and evaluate symptoms and progress in treatment and Medication management Supportive approach/coping skills Depression; will continue the Effexor at 75 mg but will increase the Abilify to 5 mg Mood instability; will increase the Abilify to 5 mg daily Will work with CBT/mindfulness and other strategies to address the stress he is under Will help clarify feelings, goals for the relationship and to work to improve assertive communication Will consider D/C in the monrning He is now thinking he would like to pursue treatment in  [place] so  he can work on the relationship  Medical Decision Making:  Review of Psycho-Social Stressors (1), Review of Medication Regimen & Side Effects (2) and Review of New Medication or Change in Dosage (2)     Shaughnessy Gethers A 10/20/2014, 5:22 PM

## 2014-10-21 DIAGNOSIS — F332 Major depressive disorder, recurrent severe without psychotic features: Principal | ICD-10-CM

## 2014-10-21 MED ORDER — ALBUTEROL SULFATE HFA 108 (90 BASE) MCG/ACT IN AERS: 1.0000 | INHALATION_SPRAY | Freq: Four times a day (QID) | RESPIRATORY_TRACT | Status: DC | PRN

## 2014-10-21 MED ORDER — NICOTINE 21 MG/24HR TD PT24: 21.0000 mg | MEDICATED_PATCH | Freq: Every day | TRANSDERMAL | Status: DC

## 2014-10-21 MED ORDER — VENLAFAXINE HCL ER 75 MG PO CP24: 75.0000 mg | ORAL_CAPSULE | Freq: Every day | ORAL | Status: DC

## 2014-10-21 MED ORDER — LORATADINE 10 MG PO TABS: 10.0000 mg | ORAL_TABLET | Freq: Every day | ORAL | Status: DC

## 2014-10-21 MED ORDER — ARIPIPRAZOLE 5 MG PO TABS: 5.0000 mg | ORAL_TABLET | Freq: Every day | ORAL | Status: DC

## 2014-10-21 MED ORDER — ESOMEPRAZOLE MAGNESIUM 40 MG PO CPDR: 40.0000 mg | DELAYED_RELEASE_CAPSULE | Freq: Every day | ORAL | Status: DC

## 2014-10-21 MED ORDER — ASPIRIN 81 MG PO TBEC: 81.0000 mg | DELAYED_RELEASE_TABLET | Freq: Every day | ORAL | Status: DC

## 2014-10-21 NOTE — [facility] Group Notes (Signed)
[facility] Group Notes:  (Clinical Social Work)  10/21/2014     10-11AM  Summary of Progress/Problems: The main focus of today's process group was to learn how to use a decisional balance exercise to move forward. Motivational Interviewing was utilized to help patients explore in depth the perceived benefits and costs of unhealthy coping techniques, as well as the benefits and costs of replacing that with a healthy coping skills. The patient expressed that his unhealthy coping includes drinking, isolation and internalizing.  He made a number of insightful statements to group, especially in talking about taking personal responsibility for making a plan A and a plan B when a decision is reached about changing, that nobody else can take the actions for us.  Type of Therapy:  Group Therapy - Process   Participation Level:  Active  Participation Quality:  Attentive, Sharing and Supportive  Affect:  Appropriate  Cognitive:  Alert, Appropriate and Oriented  Insight:  Engaged  Engagement in Therapy:  Engaged  Modes of Intervention:  Education, Motivational Interviewing  Ambrose MantleMareida Grossman-Orr, LCSW 10/21/2014, 12:38 PM

## 2014-10-21 NOTE — Progress Notes (Signed)
Discharge orders received. Pt. Agreeable to discharge. AVS and prescriptions provided and reviewed. Pt. Verbalizes understanding of discharge instructions. Pt. Offers no questions or concerns. Pt. Denies SI, HI, pain or discomfort. Pt. Belongings returned and signed for. Pt. escorted to lobby.

## 2014-10-21 NOTE — [facility] Suicide Risk Assessment (Signed)
[facility]   Demographic Factors:  Male  Total Time spent with patient: 30 minutes  Musculoskeletal: Strength & Muscle Tone: within normal limits Gait & Station: normal Patient leans: no lean  Psychiatric Specialty Exam: Physical Exam  Constitutional: He appears well-developed and well-nourished.  HENT:  Head: Normocephalic.    Review of Systems  Constitutional: Negative.   Cardiovascular: Negative for chest pain.  Gastrointestinal: Negative for nausea.  Neurological: Negative for tremors.  Psychiatric/Behavioral: Negative for depression and suicidal ideas.    Blood pressure 107/70, pulse 72, temperature 98.3 F (36.8 C), temperature source Oral, resp. rate 17, height 6\' 1"  (1.854 m), weight 70.761 kg (156 lb), SpO2 94 %.Body mass index is 20.59 kg/(m^2).  General Appearance: Casual  Eye Contact::  Fair  Speech:  Normal Rate409  Volume:  Normal  Mood:  Euthymic  Affect:  Congruent  Thought Process:  Coherent  Orientation:  Full (Time, Place, and Person)  Thought Content:  clear and coherent  Suicidal Thoughts:  No  Homicidal Thoughts:  No  Memory:  Immediate;   Fair Recent;   Fair  Judgement:  Fair  Insight:  Fair  Psychomotor Activity:  Normal  Concentration:  Fair  Recall:  [employer]  Fund of Knowledge:Fair  Language: Fair  Akathisia:  Negative  Handed:  Right  AIMS (if indicated):     Assets:  Communication Skills Desire for Improvement  Sleep:  Number of Hours: 6  Cognition: WNL  ADL's:  Intact   Have you used any form of tobacco in the last 30 days? (Cigarettes, Smokeless Tobacco, Cigars, and/or Pipes): Yes  Has this patient used any form of tobacco in the last 30 days? (Cigarettes, Smokeless Tobacco, Cigars, and/or Pipes) prescription options given  Mental Status Per Nursing Assessment::   On Admission:  Suicidal ideation indicated by patient  Current Mental Status by Physician: see MSE above  Loss Factors: Financial  problems/change in socioeconomic status  Historical Factors: Impulsivity  Risk Reduction Factors:   Positive coping skills or problem solving skills  Continued Clinical Symptoms:  Unstable or Poor Therapeutic Relationship  Cognitive Features That Contribute To Risk:  None    Suicide Risk:  Minimal: No identifiable suicidal ideation.  Patients presenting with no risk factors but with morbid ruminations; may be classified as minimal risk based on the severity of the depressive symptoms  Principal Problem: Major depressive disorder, recurrent episode, severe Discharge Diagnoses:  Patient Active Problem List   Diagnosis Date Noted  . Major depressive disorder, recurrent episode, severe [F33.2] 10/17/2014  . Major depressive disorder, recurrent severe without psychotic features [F33.2] 10/16/2014    Follow-up Information    [facility] with [facility].   Why:  Call [place] [name] at [phone] at discharge to schedule assessment for Psych IOP program (per [place]).    Contact information:   [address] [place], [place] [address] Phone: [phone] Fax: [phone]      [facility]   Why:  Social worker left message for Jamie Hood on 10/19/14. He is out of the office until Tuesday 10/24/14. Please call [name] on 10/24/14 to schedule therapy appt. Thank you.   Contact information:   ATTN: Jamie BoltErnest Hood [address] [place], [place] [address] Phone: [phone] Fax: [phone]      Follow up with [facility] On 11/02/2014.   Why:  Appt on this date at 11:00AM for medication management/hospital follow-up.    Contact information:  [address] [place], [place] [address] Phone: [phone] Fax: [phone]      Plan Of Care/Follow-up recommendations:  Activity:  as tolerated Diet:  regular  See Discharge summary for details also.   Is patient on multiple antipsychotic therapies at discharge:  No   Has Patient had three  or more failed trials of antipsychotic monotherapy by history:  No  Recommended Plan for Multiple Antipsychotic Therapies: NA    Jamie Hood 10/21/2014, 12:04 PM

## 2014-10-21 NOTE — Discharge Summary (Signed)
Physician Discharge Summary Note  Patient:  Jamie Hood is an 29 y.o., male MRN:  [id number] DOB:  07/24/1985 Patient phone:  [phone] (home)  Patient address:   [address] [place] [address],  Total Time spent with patient: 45 minutes  Date of Admission:  10/17/2014 Date of Discharge: 10/21/2014  Reason for Admission:  Depression  Principal Problem: Major depressive disorder, recurrent episode, severe Discharge Diagnoses: Patient Active Problem List   Diagnosis Date Noted  . Major depressive disorder, recurrent episode, severe [F33.2] 10/17/2014  . Major depressive disorder, recurrent severe without psychotic features [F33.2] 10/16/2014    Musculoskeletal: Strength & Muscle Tone: within normal limits Gait & Station: normal Patient leans: N/A  Psychiatric Specialty Exam:  SEE SRA Physical Exam  Vitals reviewed.   Review of Systems  Psychiatric/Behavioral: The patient is nervous/anxious.   All other systems reviewed and are negative.   Blood pressure 107/70, pulse 72, temperature 98.3 F (36.8 C), temperature source Oral, resp. rate 17, height 6\' 1"  (1.854 m), weight 70.761 kg (156 lb), SpO2 94 %.Body mass index is 20.59 kg/(m^2).  Have you used any form of tobacco in the last 30 days? (Cigarettes, Smokeless Tobacco, Cigars, and/or Pipes): Yes  Has this patient used any form of tobacco in the last 30 days? (Cigarettes, Smokeless Tobacco, Cigars, and/or Pipes) Yes, Prescription not provided because: Rx and samples given to patient  Past Medical History:  Past Medical History  Diagnosis Date  . Asthma   . PE (pulmonary embolism)     Past Surgical History  Procedure Laterality Date  . Hernia repair    . Incision and drainage wound with foreign body removal     Family History: History reviewed. No pertinent family history. Social History:  History  Alcohol Use  . Yes    Comment: social     History  Drug Use No    History   Social History  .  Marital Status: Single    Spouse Name: N/A  . Number of Children: N/A  . Years of Education: N/A   Social History Main Topics  . Smoking status: Never Smoker   . Smokeless tobacco: Not on file  . Alcohol Use: Yes     Comment: social  . Drug Use: No  . Sexual Activity: Not on file   Other Topics Concern  . None   Social History Narrative   Risk to Self: Is patient at risk for suicide?: No What has been your use of drugs/alcohol within the last 12 months?: alcohol abuse-when my emotions get bad. Pt reports drinking 3-4 times per week; mostly on weekends and sometimes blacks out. pt reports recent cocaine use-first time ever a few days ago. no other substance abuse identified.  Risk to Others:   Prior Inpatient Therapy:   Prior Outpatient Therapy:    Level of Care:  OP  Hospital Course:   Jamie Hood was admitted for Major depressive disorder, recurrent episode, severe and crisis management.  He was treated discharged with the medications listed below under Medication List.  Medical problems were identified and treated as needed.  Home medications were restarted as appropriate.  Improvement was monitored by observation and Jamie Hood daily report of symptom reduction.  Emotional and mental status was monitored by daily self-inventory reports completed by Jamie Hood and clinical staff.         Jamie Hood was evaluated by the treatment team for stability and plans for continued recovery upon discharge.  Jamie Hood motivation was an integral factor for scheduling further treatment.  Employment, transportation, bed availability, health status, family support, and any pending legal issues were also considered during his hospital stay.  He was offered further treatment options upon discharge including but not limited to Residential, Intensive Outpatient, and Outpatient treatment.  Jamie Hood will follow up with the services as listed below under Follow Up Information.      Upon completion of this admission the patient was both mentally and medically stable for discharge denying suicidal/homicidal ideation, auditory/visual/tactile hallucinations, delusional thoughts and paranoia.      Consults:  psychiatry  Significant Diagnostic Studies:  labs: per ED  Discharge Vitals:   Blood pressure 107/70, pulse 72, temperature 98.3 F (36.8 C), temperature source Oral, resp. rate 17, height  (1.854 m), weight 70.761 kg (156 lb), SpO2 94 %. Body mass index is 20.59 kg/(m^2). Lab Results:   No results found for this or any previous visit (from the past 72 hour(s)).  Physical Findings: AIMS: Facial and Oral Movements Muscles of Facial Expression: None, normal Lips and Perioral Area: None, normal Jaw: None, normal Tongue: None, normal,Extremity Movements Upper (arms, wrists, hands, fingers): None, normal Lower (legs, knees, ankles, toes): None, normal, Trunk Movements Neck, shoulders, hips: None, normal, Overall Severity Severity of abnormal movements (highest score from questions above): None, normal Incapacitation due to abnormal movements: None, normal Patient's awareness of abnormal movements (rate only patient's report): No Awareness, Dental Status Current problems with teeth and/or dentures?: No Does patient usually wear dentures?: No  CIWA:  CIWA-Ar Total: 0 COWS:      See Psychiatric Specialty Exam and Suicide Risk Assessment completed by Attending Physician prior to discharge.  Discharge destination:  Home  Is patient on multiple antipsychotic therapies at discharge:  No   Has Patient had three or more failed trials of antipsychotic monotherapy by history:  No    Recommended Plan for Multiple Antipsychotic Therapies: NA     Medication List    STOP taking these medications        MULTIVITAMIN & MINERAL PO      TAKE these medications      Indication   albuterol 108 (90 BASE) MCG/ACT inhaler  Commonly known as:  PROVENTIL  HFA;VENTOLIN HFA  Inhale 1-2 puffs into the lungs every 6 (six) hours as needed for wheezing or shortness of breath.   Indication:  Asthma     ARIPiprazole 5 MG tablet  Commonly known as:  ABILIFY  Take 1 tablet (5 mg total) by mouth daily.   Indication:  Mood Stabilization     aspirin 81 MG EC tablet  Take 1 tablet (81 mg total) by mouth daily.   Indication:  health maintenance     esomeprazole 40 MG capsule  Commonly known as:  NEXIUM  Take 1 capsule (40 mg total) by mouth daily.   Indication:  reflux     loratadine 10 MG tablet  Commonly known as:  CLARITIN  Take 1 tablet (10 mg total) by mouth daily.   Indication:  Hayfever     nicotine 21 mg/24hr patch  Commonly known as:  NICODERM CQ - dosed in mg/24 hours  Place 1 patch (21 mg total) onto the skin daily.   Indication:  Nicotine Addiction     venlafaxine XR 75 MG 24 hr capsule  Commonly known as:  EFFEXOR-XR  Take 1 capsule (75 mg total) by mouth daily with breakfast.   Indication:  Major Depressive Disorder  Follow-up Information    [facility] with [facility].   Why:  Call [place] [name] at [phone] at discharge to schedule assessment for Psych IOP program (per [place]).    Contact information:   [address] [place], [place] [address] Phone: [phone] Fax: [phone]      [facility]   Why:  Social worker left message for Mr. McCoy on 10/19/14. He is out of the office until Tuesday 10/24/14. Please call Mr. Massie MaroonMcCoy on 10/24/14 to schedule therapy appt. Thank you.   Contact information:   ATTN: Dwan BoltErnest McCoy [address] [place], [place] [address] Phone: [phone] Fax: [phone]      Follow up with [facility] On 11/02/2014.   Why:  Appt on this date at 11:00AM for medication management/hospital follow-up.    Contact information:   [address] [place], [place] [address] Phone: [phone] Fax: [phone]      Follow-up  recommendations:  Activity:  as tol, diet as tol  Comments:  1.  Take all your medications as prescribed.              2.  Report any adverse side effects to outpatient provider.                       3.  Patient instructed to not use alcohol or illegal drugs while on prescription medicines.            4.  In the event of worsening symptoms, instructed patient to call 911, the crisis hotline or go to nearest emergency room for evaluation of symptoms.  Total Discharge Time: 40 min  Signed: Velna HatchetSheila May Agustin AGNP-BC 10/21/2014, 10:01 AM  I have examined the patient and agree with the discharge plan and findings. I have also done suicide assessment on this patient.

## 2014-10-21 NOTE — Progress Notes (Signed)
D: Pt's mood is depressed but he brightens upon approach. He rates his depression 7/10 and hopelessness 3/10. Denies SI/HI/AVH. Pt states that he is looking forward to discharge. A: Support given. Verbalization encouraged. Pt encouraged to come to staff for any concerns. Medications given as prescribed. R: Pt is receptive. No complaints of pain or discomfort at this time. Q15 min safety checks maintained. Pt remains safe on the unit. Will continue to monitor.

## 2014-10-21 NOTE — [facility] Group Notes (Signed)
[facility] Group Notes:  (Nursing/MHT/Case Management/Adjunct)  Date:  10/21/2014  Time:  11:55 AM  Type of Therapy:  Psychoeducational Skills  Participation Level:  Active  Participation Quality:  Attentive  Affect:  Appropriate  Cognitive:  Appropriate  Insight:  Improving  Engagement in Group:  Engaged  Modes of Intervention:  Discussion, Education and Support  Summary of Progress/Problems: Pt stated that his goal for today was to go home. He states that some of his coping skills include being active and driving on a race track.   Leda QuailSmith, Marella Vanderpol T 10/21/2014, 11:55 AM

## 2014-10-24 ENCOUNTER — Telehealth (HOSPITAL_COMMUNITY): Payer: Self-pay | Admitting: Psychiatry

## 2014-11-03 NOTE — Clinical Social Work Note (Signed)
At patient's request, FMLA paperwork completed and faxed.  Samuella BruinKristin Axton Cihlar, MSW, [employer]CSWA Clinical Social Worker [facility] [phone]

## 2015-04-02 ENCOUNTER — Encounter (HOSPITAL_COMMUNITY): Payer: Self-pay | Admitting: Behavioral Health

## 2015-04-02 ENCOUNTER — Emergency Department (HOSPITAL_COMMUNITY)
Admission: EM | Admit: 2015-04-02 | Discharge: 2015-04-02 | Disposition: A | Discharge status: Other Acute Inpatient Hospital | Payer: 59 | Attending: Emergency Medicine | Admitting: Emergency Medicine

## 2015-04-02 ENCOUNTER — Encounter (HOSPITAL_COMMUNITY): Payer: Self-pay

## 2015-04-02 ENCOUNTER — Inpatient Hospital Stay (HOSPITAL_COMMUNITY)
Admission: AD | Admit: 2015-04-02 | Discharge: 2015-04-09 | DRG: 885 | Disposition: A | Discharge status: Home / Self Care | Payer: 59 | Source: Intra-hospital | Attending: Psychiatry | Admitting: Psychiatry

## 2015-04-02 DIAGNOSIS — Z79899 Other long term (current) drug therapy: Secondary | ICD-10-CM | POA: Insufficient documentation

## 2015-04-02 DIAGNOSIS — R45851 Suicidal ideations: Secondary | ICD-10-CM | POA: Diagnosis present

## 2015-04-02 DIAGNOSIS — F1010 Alcohol abuse, uncomplicated: Secondary | ICD-10-CM | POA: Diagnosis present

## 2015-04-02 DIAGNOSIS — Z86711 Personal history of pulmonary embolism: Secondary | ICD-10-CM

## 2015-04-02 DIAGNOSIS — J45909 Unspecified asthma, uncomplicated: Secondary | ICD-10-CM | POA: Diagnosis not present

## 2015-04-02 DIAGNOSIS — F319 Bipolar disorder, unspecified: Secondary | ICD-10-CM | POA: Diagnosis present

## 2015-04-02 DIAGNOSIS — F17200 Nicotine dependence, unspecified, uncomplicated: Secondary | ICD-10-CM | POA: Diagnosis present

## 2015-04-02 DIAGNOSIS — Z7982 Long term (current) use of aspirin: Secondary | ICD-10-CM | POA: Diagnosis not present

## 2015-04-02 DIAGNOSIS — F314 Bipolar disorder, current episode depressed, severe, without psychotic features: Secondary | ICD-10-CM | POA: Diagnosis present

## 2015-04-02 LAB — COMPREHENSIVE METABOLIC PANEL
ALK PHOS: 63 U/L (ref 38–126)
ALT: 14 U/L — ABNORMAL LOW (ref 17–63)
ANION GAP: 6 (ref 5–15)
AST: 21 U/L (ref 15–41)
Albumin: 4.5 g/dL (ref 3.5–5.0)
BILIRUBIN TOTAL: 0.8 mg/dL (ref 0.3–1.2)
BUN: 13 mg/dL (ref 6–20)
CALCIUM: 9.7 mg/dL (ref 8.9–10.3)
CO2: 27 mmol/L (ref 22–32)
Chloride: 106 mmol/L (ref 101–111)
Creatinine, Ser: 1.24 mg/dL (ref 0.61–1.24)
GFR calc non Af Amer: >60 mL/min (ref >60)
Glucose, Bld: 93 mg/dL (ref 65–99)
Potassium: 4.1 mmol/L (ref 3.5–5.1)
SODIUM: 139 mmol/L (ref 135–145)
TOTAL PROTEIN: 7.6 g/dL (ref 6.5–8.1)

## 2015-04-02 LAB — CBC
HCT: 45.1 % (ref 39.0–52.0)
Hemoglobin: 15.3 g/dL (ref 13.0–17.0)
MCH: 28.8 pg (ref 26.0–34.0)
MCHC: 33.9 g/dL (ref 30.0–36.0)
MCV: 84.9 fL (ref 78.0–100.0)
Platelets: 142 10*3/uL — ABNORMAL LOW (ref 150–400)
RBC: 5.31 MIL/uL (ref 4.22–5.81)
RDW: 13.9 % (ref 11.5–15.5)
WBC: 2.9 10*3/uL — ABNORMAL LOW (ref 4.0–10.5)

## 2015-04-02 LAB — SALICYLATE LEVEL

## 2015-04-02 LAB — RAPID URINE DRUG SCREEN, HOSP PERFORMED
Amphetamines: NOT DETECTED
Barbiturates: NOT DETECTED
Benzodiazepines: NOT DETECTED
COCAINE: NOT DETECTED
OPIATES: NOT DETECTED
Tetrahydrocannabinol: NOT DETECTED

## 2015-04-02 LAB — ACETAMINOPHEN LEVEL

## 2015-04-02 LAB — ETHANOL: Alcohol, Ethyl (B): <5 mg/dL (ref <5)

## 2015-04-02 MED ORDER — CHLORDIAZEPOXIDE HCL 25 MG PO CAPS
25.0000 mg | ORAL_CAPSULE | Freq: Four times a day (QID) | ORAL | Status: DC | PRN
Start: 2015-04-02 — End: 2015-04-09
  Administered 2015-04-02 – 2015-04-09 (×13): 25 mg via ORAL
  Filled 2015-04-02 (×13): qty 1

## 2015-04-02 MED ORDER — ALUM & MAG HYDROXIDE-SIMETH 200-200-20 MG/5ML PO SUSP
30.0000 mL | ORAL | Status: DC | PRN
Start: 2015-04-02 — End: 2015-04-09

## 2015-04-02 MED ORDER — VENLAFAXINE HCL ER 75 MG PO CP24
75.0000 mg | ORAL_CAPSULE | Freq: Every day | ORAL | Status: DC
  Filled 2015-04-02: qty 1

## 2015-04-02 MED ORDER — ACETAMINOPHEN 325 MG PO TABS: 650.0000 mg | ORAL_TABLET | ORAL | Status: DC | PRN

## 2015-04-02 MED ORDER — NICOTINE 21 MG/24HR TD PT24
21.0000 mg | MEDICATED_PATCH | Freq: Every day | TRANSDERMAL | Status: DC
  Administered 2015-04-03 – 2015-04-09 (×7): 21 mg via TRANSDERMAL
  Filled 2015-04-02 (×12): qty 1

## 2015-04-02 MED ORDER — FLUOXETINE HCL 20 MG PO CAPS
20.0000 mg | ORAL_CAPSULE | Freq: Every day | ORAL | Status: DC
  Administered 2015-04-03: 20 mg via ORAL
  Filled 2015-04-02 (×3): qty 1

## 2015-04-02 MED ORDER — NICOTINE 21 MG/24HR TD PT24
21.0000 mg | MEDICATED_PATCH | Freq: Every day | TRANSDERMAL | Status: DC
  Administered 2015-04-02: 21 mg via TRANSDERMAL
  Filled 2015-04-02: qty 1

## 2015-04-02 MED ORDER — ACETAMINOPHEN 325 MG PO TABS
650.0000 mg | ORAL_TABLET | Freq: Four times a day (QID) | ORAL | Status: DC | PRN
  Administered 2015-04-03: 650 mg via ORAL
  Filled 2015-04-02: qty 2

## 2015-04-02 MED ORDER — VENLAFAXINE HCL ER 75 MG PO CP24
75.0000 mg | ORAL_CAPSULE | Freq: Every day | ORAL | Status: DC
  Administered 2015-04-03: 75 mg via ORAL
  Filled 2015-04-02 (×3): qty 1

## 2015-04-02 MED ORDER — ONDANSETRON HCL 4 MG PO TABS
4.0000 mg | ORAL_TABLET | Freq: Three times a day (TID) | ORAL | Status: DC | PRN
Start: 2015-04-02 — End: 2015-04-09

## 2015-04-02 MED ORDER — MAGNESIUM HYDROXIDE 400 MG/5ML PO SUSP: 30.0000 mL | Freq: Every day | ORAL | Status: DC | PRN

## 2015-04-02 MED ORDER — IBUPROFEN 600 MG PO TABS: 600.0000 mg | ORAL_TABLET | Freq: Three times a day (TID) | ORAL | Status: DC | PRN

## 2015-04-02 MED ORDER — PANTOPRAZOLE SODIUM 40 MG PO TBEC
40.0000 mg | DELAYED_RELEASE_TABLET | Freq: Every day | ORAL | Status: DC
  Administered 2015-04-02: 40 mg via ORAL
  Filled 2015-04-02: qty 1

## 2015-04-02 MED ORDER — PANTOPRAZOLE SODIUM 40 MG PO TBEC
40.0000 mg | DELAYED_RELEASE_TABLET | Freq: Every day | ORAL | Status: DC
  Administered 2015-04-03 – 2015-04-09 (×7): 40 mg via ORAL
  Filled 2015-04-02 (×10): qty 1

## 2015-04-02 MED ORDER — LAMOTRIGINE 25 MG PO TABS
50.0000 mg | ORAL_TABLET | Freq: Every evening | ORAL | Status: DC
  Filled 2015-04-02: qty 2

## 2015-04-02 MED ORDER — ARIPIPRAZOLE 5 MG PO TABS
5.0000 mg | ORAL_TABLET | Freq: Every day | ORAL | Status: DC
  Filled 2015-04-02: qty 1

## 2015-04-02 MED ORDER — IBUPROFEN 200 MG PO TABS: 600.0000 mg | ORAL_TABLET | Freq: Three times a day (TID) | ORAL | Status: DC | PRN

## 2015-04-02 MED ORDER — ZOLPIDEM TARTRATE 5 MG PO TABS
5.0000 mg | ORAL_TABLET | Freq: Every evening | ORAL | Status: DC | PRN
  Administered 2015-04-02: 5 mg via ORAL
  Filled 2015-04-02: qty 1

## 2015-04-02 MED ORDER — ZOLPIDEM TARTRATE 5 MG PO TABS
5.0000 mg | ORAL_TABLET | Freq: Every evening | ORAL | Status: DC | PRN
Start: 2015-04-02 — End: 2015-04-02

## 2015-04-02 MED ORDER — LAMOTRIGINE 25 MG PO TABS
50.0000 mg | ORAL_TABLET | Freq: Every evening | ORAL | Status: DC
  Administered 2015-04-02: 50 mg via ORAL
  Filled 2015-04-02: qty 2

## 2015-04-02 MED ORDER — FLUOXETINE HCL 20 MG PO CAPS
20.0000 mg | ORAL_CAPSULE | Freq: Every day | ORAL | Status: DC
  Administered 2015-04-02: 20 mg via ORAL
  Filled 2015-04-02: qty 1

## 2015-04-02 MED ORDER — ONDANSETRON HCL 4 MG PO TABS: 4.0000 mg | ORAL_TABLET | Freq: Three times a day (TID) | ORAL | Status: DC | PRN

## 2015-04-02 MED ORDER — ALUM & MAG HYDROXIDE-SIMETH 200-200-20 MG/5ML PO SUSP: 30.0000 mL | ORAL | Status: DC | PRN

## 2015-04-02 NOTE — ED Notes (Signed)
Per pt, hx manic bipolar and see counselor.  Pt seen a few months ago with SI.  Pt was seen by counselor last week.  He has been placed on lamictal for the last 3 weeks.  Pt states depression with increased alcohol consumption.  Pt states "slight SI".  Told by counselor if symptoms increase to come to ED.  No HI.  No hallucinations
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
## 2015-04-02 NOTE — ED Notes (Signed)
Pt belonging four keys, two car keys, black phone with broken screen, set of ear ring, one pill bottom, [place] id, [place] id, [place] license, red visa card ending in [phone], black visa card ending in [phone], paypal card ending in [phone], $1
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
## 2015-04-02 NOTE — ED Notes (Signed)
Patient ambulated back to [facility] independently and went to bed.  He has remained in the bed with the covers over his head since arrival.  Agrees to admission if a bed is available.  States he has had constant thoughts of self harm.  Has been quiet and cooperative.

## 2015-04-03 ENCOUNTER — Encounter (HOSPITAL_COMMUNITY): Payer: Self-pay | Admitting: Behavioral Health

## 2015-04-03 DIAGNOSIS — R45851 Suicidal ideations: Secondary | ICD-10-CM

## 2015-04-03 MED ORDER — ALBUTEROL SULFATE HFA 108 (90 BASE) MCG/ACT IN AERS
1.0000 | INHALATION_SPRAY | RESPIRATORY_TRACT | Status: DC | PRN
  Administered 2015-04-03 – 2015-04-08 (×3): 2 via RESPIRATORY_TRACT

## 2015-04-03 MED ORDER — TRAZODONE HCL 50 MG PO TABS
50.0000 mg | ORAL_TABLET | Freq: Every evening | ORAL | Status: DC | PRN
  Administered 2015-04-03 – 2015-04-05 (×3): 50 mg via ORAL
  Filled 2015-04-03 (×2): qty 1

## 2015-04-03 MED ORDER — ALBUTEROL SULFATE HFA 108 (90 BASE) MCG/ACT IN AERS
INHALATION_SPRAY | RESPIRATORY_TRACT | Status: AC
  Administered 2015-04-03: 16:00:00
  Filled 2015-04-03: qty 6.7

## 2015-04-03 MED ORDER — LORATADINE 10 MG PO TABS
ORAL_TABLET | ORAL | Status: AC
  Filled 2015-04-03: qty 1

## 2015-04-03 MED ORDER — LORATADINE 10 MG PO TABS
10.0000 mg | ORAL_TABLET | Freq: Every day | ORAL | Status: DC
  Administered 2015-04-03 – 2015-04-09 (×7): 10 mg via ORAL
  Filled 2015-04-03 (×10): qty 1

## 2015-04-03 MED ORDER — LAMOTRIGINE 25 MG PO TABS
50.0000 mg | ORAL_TABLET | Freq: Two times a day (BID) | ORAL | Status: DC
  Administered 2015-04-03 – 2015-04-09 (×12): 50 mg via ORAL
  Filled 2015-04-03 (×17): qty 2

## 2015-04-03 NOTE — [facility] Group Notes (Signed)
[facility] Group Notes:  (Nursing/MHT/Case Management/Adjunct)  Date:  04/03/2015  Time:  0900  Type of Therapy:  Nurse Education  Participation Level:  Did not attend  Participation Quality:    Affect:    Cognitive:    Insight:    Engagement in Group:    Modes of Intervention:    Summary of Progress/Problems: patient invited however did not attend group, elected to remain in bed.  Merian CapronFriedman, Merlon Alcorta [facility] 04/03/2015, 0930

## 2015-04-03 NOTE — Plan of Care (Signed)
Problem: Alteration in mood Goal: STG-Patient reports thoughts of self-harm to staff Outcome: Progressing Patient endorsed passive SI last evening however denies this AM  Problem: Alteration in mood & ability to function due to Goal: STG-Patient will comply with prescribed medication regimen (Patient will comply with prescribed medication regimen)  Outcome: Progressing Patient med compliant thus far.

## 2015-04-03 NOTE — Tx Team (Addendum)
Initial Interdisciplinary Treatment Plan   PATIENT STRESSORS: Legal issue Medication change or noncompliance Substance abuse   PATIENT STRENGTHS: Ability for insight Active sense of humor Communication skills General fund of knowledge Supportive family/friends   PROBLEM LIST: Problem List/Patient Goals Date to be addressed Date deferred Reason deferred Estimated date of resolution  "To get a better mindset and get out of manic swing." 04/03/2015     "Help with my alcohol use because it is out of control" 04/03/2015     Suicide risk 04/03/2015     depression 04/03/2015     anxiety 04/03/2015                              DISCHARGE CRITERIA:  Ability to meet basic life and health needs Improved stabilization in mood, thinking, and/or behavior Safe-care adequate arrangements made Withdrawal symptoms are absent or subacute and managed without 24-hour nursing intervention  PRELIMINARY DISCHARGE PLAN: Attend aftercare/continuing care group Attend 12-step recovery group Outpatient therapy Return to previous living arrangement  PATIENT/FAMIILY INVOLVEMENT: This treatment plan has been presented to and reviewed with the patient, Jamie Hood.  The patient and family have been given the opportunity to ask questions and make suggestions.  Angeline SlimHill, Ashley M 04/03/2015, 12:26 AM

## 2015-04-03 NOTE — Progress Notes (Signed)
D: Pt found in dayroom upon assessment. Pt c/o anxiety and back pain rated 5/10. Pt request inhaler before bedtime. Pt denies SI/HI/AVH at this time.  A: Emotional support given. PRN medications given. q15 minute checks. R: Safety is maintained.

## 2015-04-03 NOTE — Progress Notes (Signed)
Patient initially somewhat isolative this morning. During 1:1 with this Clinical research associatewriter, patient tearful. States, "I messed up again." Complaining of anxiety and asking for a prn. Does ask about programming and states he wants to attend. More visible in the afternoon. Went outside for rec time with peers and upon return to the unit, complaining of SOB. MD made aware, order received for inhaler and patient medicated (along with other scheduled meds). Emotional support and reassurance provided. Patient calmer after 1:1. Inhaler effective. He denies SI/HI and remains safe. Lawrence MarseillesFriedman, Caidence Kaseman Eakes
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
## 2015-04-03 NOTE — [facility] Group Notes (Signed)
[facility] LCSW Group Therapy  04/03/2015 1:04 PM  Type of Therapy:  Group Therapy  Participation Level:  Active  Participation Quality:  Attentive  Affect:  Appropriate  Cognitive:  Alert and Oriented  Insight:  Improving  Engagement in Therapy:  Improving  Modes of Intervention:  Discussion, Education, Exploration, Problem-solving, Rapport Building, Socialization and Support  Summary of Progress/Problems: [facility]HA Speaker came to talk about his personal journey with substance abuse and addiction. The pt processed ways by which to relate to the speaker. MHA speaker provided handouts and educational information pertaining to groups and services offered by the [facility].   Smart, Jamie Hood LCSWA 04/03/2015, 1:04 PM

## 2015-04-03 NOTE — Progress Notes (Signed)
Pt attended the AA speaker meeting. Pt was attentive and engaged. 

## 2015-04-03 NOTE — Tx Team (Signed)
Interdisciplinary Treatment Plan Update (Adult)  Date:  04/03/2015  Time Reviewed:  8:32 AM   Progress in Treatment: Attending groups: No. Participating in groups:  No. Taking medication as prescribed:  Yes. Tolerating medication:  Yes. Family/Significant othe contact made:  SPE required for this pt.  Patient understands diagnosis:  Yes.AEB seeking treatment for depression, passive SI, ETOh abuse, and for medication stabilization.  Discussing patient identified problems/goals with staff:  Yes. Medical problems stabilized or resolved:  Yes. Denies suicidal/homicidal ideation: No. Passive SI/Able to contract for safety on the unit.  Issues/concerns per patient self-inventory:  Other  Discharge Plan or Barriers: CSW assessing for appropriate referrals. Pt has not gotten out of bed at this point to meet with MD/CSW or to complete PSA.   Reason for Continuation of Hospitalization: Depression Medication stabilization Suicidal ideation Withdrawal symptoms  Comments:  29 y/o male who c/o etoh abuse, anxiety, depression and SI. Patient states, "My drinking has gotten out of control." Patient states he has been drinking every day 6-7 beers a day and sometimes more. Patient states he was here at [facility] earlier this year. Patient states he has just recently gotten back on his psychotropic medications and has been compliant. Patient states he is recently broke up with his fiance and had to move back to the area. Patient states he recently got a DUI and has a court date this week. Patient states, "I am ready to stop drinking because it is ruining my life." Patient states he is currently on disability and he is bored and he has nothing to do during the day. Patient states all of his friends drinking alcohol and smoke weed. Patient states he is currently passive SI but verbally contracts for safety. Patient denies HI and states he has auditory hallucinations saying negative things. Patient  denies physical, sexual, and verbal abuse.  Estimated length of stay:  3-5 days   New goal(s): to develop effective aftercare plan.   Additional Comments:  Patient and CSW reviewed pt's identified goals and treatment plan. Patient verbalized understanding and agreed to treatment plan. CSW reviewed [facility] "Discharge Process and Patient Involvement" Form. Pt verbalized understanding of information provided and signed form.    Review of initial/current patient goals per problem list:  1. Goal(s): Patient will participate in aftercare plan  Met: No.   Target date: at discharge  As evidenced by: Patient will participate within aftercare plan AEB aftercare provider and housing plan at discharge being identified.  10/25: CSW assessing for appropriate referrals.   2. Goal (s): Patient will exhibit decreased depressive symptoms and suicidal ideations.  Met: No.    Target date: at discharge  As evidenced by: Patient will utilize self rating of depression at 3 or below and demonstrate decreased signs of depression or be deemed stable for discharge by MD.  10/25: Pt rates depression as high and endorses passive Si/Able to contract for safety on the unit.   3. Goal(s): Patient will demonstrate decreased signs of withdrawal due to substance abuse  Met:No.   Target date:at discharge   As evidenced by: Patient will produce a CIWA/COWS score of 0, have stable vitals signs, and no symptoms of withdrawal.  10/25: Pt reports mild withdrawals with CIWA score of 2 and stable vitals.   Attendees: Patient:   04/03/2015 8:32 AM   Family:   04/03/2015 8:32 AM   Physician:  Dr. Carlton Adam, MD 04/03/2015 8:32 AM   Nursing:   Ok Edwards RN 04/03/2015 8:32 AM  Clinical Social Worker: [place], [place]  04/03/2015 8:32 AM   Clinical Social Worker: Erasmo Downer Drinkard LCSWA; Peri Maris LCSWA 04/03/2015 8:32 AM   Other:  Gerline Legacy Nurse Case Manager 04/03/2015 8:32 AM   Other:   Lucinda Dell; [facility]  04/03/2015 8:32 AM   Other:   04/03/2015 8:32 AM   Other:  04/03/2015 8:32 AM   Other:  04/03/2015 8:32 AM   Other:  04/03/2015 8:32 AM    04/03/2015 8:32 AM    04/03/2015 8:32 AM    04/03/2015 8:32 AM    04/03/2015 8:32 AM    Scribe for Treatment Team:   Maxie Better, [place]  04/03/2015 8:32 AM
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
## 2015-04-03 NOTE — [facility] Suicide Risk Assessment (Signed)
[facility]   Nursing information obtained from:    Demographic factors:    Current Mental Status:    Loss Factors:    Historical Factors:    Risk Reduction Factors:    Total Time spent with patient: 45 minutes Principal Problem: Bipolar affective disorder, depressed, severe (HCC) Diagnosis:   Patient Active Problem List   Diagnosis Date Noted  . Bipolar affective disorder, depressed, severe (HCC) [F31.4] 04/02/2015  . Alcohol abuse [F10.10] 04/02/2015     Continued Clinical Symptoms:  Alcohol Use Disorder Identification Test Final Score (AUDIT): 28 The "Alcohol Use Disorders Identification Test", Guidelines for Use in Primary Care, Second Edition.  World Science writerHealth Organization [facility](WHO). Score between 0-7:  no or low risk or alcohol related problems. Score between 8-15:  moderate risk of alcohol related problems. Score between 16-19:  high risk of alcohol related problems. Score 20 or above:  warrants further diagnostic evaluation for alcohol dependence and treatment.   CLINICAL FACTORS:   Bipolar Disorder:   Depressive phase Alcohol/Substance Abuse/Dependencies  Psychiatric Specialty Exam: Physical Exam  ROS  Blood pressure 104/69, pulse 70, temperature 97.8 F (36.6 C), temperature source Oral, resp. rate 16, height 6' (1.829 m), weight 78.472 kg (173 lb), SpO2 100 %.Body mass index is 23.46 kg/(m^2).   COGNITIVE FEATURES THAT CONTRIBUTE TO RISK:  Closed-mindedness, Polarized thinking and Thought constriction (tunnel vision)    SUICIDE RISK:   Moderate:  Frequent suicidal ideation with limited intensity, and duration, some specificity in terms of plans, no associated intent, good self-control, limited dysphoria/symptomatology, some risk factors present, and identifiable protective factors, including available and accessible social support.  PLAN OF CARE: see admission H and P  Medical Decision Making:  Review of Psycho-Social Stressors (1), Review  or order clinical lab tests (1), Review of Medication Regimen & Side Effects (2) and Review of New Medication or Change in Dosage (2)  I certify that inpatient services furnished can reasonably be expected to improve the patient's condition.   Jakwon Gayton A 04/03/2015, 6:45 PM

## 2015-04-03 NOTE — Plan of Care (Signed)
Problem: Ineffective individual coping Goal: STG: Patient will remain free from self harm Outcome: Progressing Pt remains free from harm. Complies with medication regimen.  Problem: Diagnosis: Increased Risk For Suicide Attempt Goal: LTG-Patient Will Report Improved Mood and Deny Suicidal LTG (by discharge) Patient will report improved mood and deny suicidal ideation.  Outcome: Progressing Pt denies SI.
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
## 2015-04-04 MED ORDER — BUPROPION HCL ER (XL) 150 MG PO TB24: 150.0000 mg | ORAL_TABLET | Freq: Every day | ORAL | Status: DC

## 2015-04-04 MED ORDER — BUPROPION HCL ER (XL) 150 MG PO TB24
150.0000 mg | ORAL_TABLET | Freq: Every day | ORAL | Status: DC
  Administered 2015-04-05 – 2015-04-09 (×5): 150 mg via ORAL
  Filled 2015-04-04 (×7): qty 1

## 2015-04-04 MED ORDER — DIPHENHYDRAMINE HCL 50 MG PO CAPS
50.0000 mg | ORAL_CAPSULE | Freq: Once | ORAL | Status: AC
  Administered 2015-04-04: 50 mg via ORAL
  Filled 2015-04-04: qty 1
  Filled 2015-04-04: qty 2

## 2015-04-04 NOTE — Progress Notes (Signed)
Recreation Therapy Notes  Date: 10.26.2016 Time: 9:30am Location: [address] Hall Group room   Group Topic: Stress Management  Goal Area(s) Addresses:  Patient will actively participate in stress management techniques presented during session.   Behavioral Response: Appropriate   Intervention: Stress management techniques  Activity :  Deep Breathing and Guided Imagery. LRT provided instruction and demonstration on practice of Guided Imagery. Technique was coupled with deep breathing.   Education:  Stress Management, Discharge Planning.   Education Outcome: Acknowledges education/In group clarification offered/Needs additional education  Clinical Observations/Feedback: Patient actively engaged in technique introduced, expressed no concerns and demonstrated ability to practice independently post d/c.   Marykay Lexenise L Jashley Yellin, LRT/CTRS        Evangela Heffler L 04/04/2015 2:55 PM
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
## 2015-04-04 NOTE — Progress Notes (Signed)
Patient flat, depressed today however shows slight improvement. No tearful episodes. More visible on the unit. Rating his depression, hopelessness and anxiety all at a 6/10. States his goal for today is to work on his depression. Provided supervisor's phone number so that he could notify work of his absences. Emotional support and reassurance given. Medicated per orders. No prn's requested or required thus far today. He denies SI/HI/AVh and remains safe. Lawrence MarseillesFriedman, Meeka Cartelli Eakes

## 2015-04-04 NOTE — Plan of Care (Signed)
Problem: Alteration in mood Goal: STG-Patient is able to discuss feelings and issues (Patient is able to discuss feelings and issues leading to depression)  Outcome: Progressing Patient less guarded this morning. More expressive.  Problem: Alteration in mood; excessive anxiety as evidenced by: Goal: STG-Pt will report an absence of self-harm thoughts/actions (Patient will report an absence of self-harm thoughts or actions)  Outcome: Progressing Patient denies SI or thoughts to self harm.

## 2015-04-04 NOTE — [facility] Group Notes (Signed)
[facility]   04/04/2015 11:18 AM  Participation Quality:  DID NOT ATTEND. Pt chose to remain in bed to rest.   Smart, [name] LCSWA

## 2015-04-04 NOTE — Progress Notes (Signed)
Pt attended the NA speaker meeting. Pt was engaged and attentive. 

## 2015-04-04 NOTE — [facility] Group Notes (Signed)
[facility] LCSW Group Therapy  04/04/2015 12:30 PM  Type of Therapy:  Group Therapy  Participation Level:  Active  Participation Quality:  Attentive  Affect:  Appropriate  Cognitive:  Alert and Oriented  Insight:  Improving  Engagement in Therapy:  Improving  Modes of Intervention:  Confrontation, Discussion, Education, Exploration, Problem-solving, Rapport Building, Socialization and Support  Summary of Progress/Problems: Today's Topic: Overcoming Obstacles. Patients identified one short term goal and potential obstacles in reaching this goal. Patients processed barriers involved in overcoming these obstacles. Patients identified steps necessary for overcoming these obstacles and explored motivation (internal and external) for facing these difficulties head on. Elige RadonBradley was attentive and engaged during today's processing group. He shared that his biggest obstacle is figuring out whether or not to return to work. 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   Smart, Nikkia Devoss LCSWA  04/04/2015, 12:30 PM

## 2015-04-05 NOTE — Progress Notes (Signed)
D. Pt had been up and visible in milieu this evening, did attend and participate in evening group activity. Pt spoke of his day and spoke about some feelings of depression he is still experiencing and spoke about how he feels he needs some different medications to help with his symptoms. Pt spoke about the importance of staying on his medications and how the medications have benefited him in the past. Pt did receive medications this evening without incident. A. Support and encouragement provided. R. Safety maintained, will continue to monitor.

## 2015-04-05 NOTE — [facility] Group Notes (Signed)

## 2015-04-05 NOTE — Progress Notes (Signed)
[facility] MD Progress Note  04/05/2015 5:02 PM Jamie Hood  MRN:  [id number] Subjective:  Jamie Hood states he is not feeling too well today. Admits to feeling very down, depressed. He is trying to think how he is going to handle things when he is out of here. States his close friends all drink and they all live around his mother's house where he will be staying. States he is not that strong to resist if they insist that he drinks. This has gotten him very down depressed thinking that he would have to let go of his friends having to let go of his fiancee, the mother of his child and his child. States when he starts thinking like he starts thinking about suicide. He is particularly having a hard time and triggered as this is [employer] and he was active and organized some of the  activities and there was a lot of drinking going on. He is scared of being out of here this weekend in particular  Principal Problem: Bipolar affective disorder, depressed, severe (HCC) Diagnosis:   Patient Active Problem List   Diagnosis Date Noted  . Bipolar affective disorder, depressed, severe (HCC) [F31.4] 04/02/2015  . Alcohol abuse [F10.10] 04/02/2015   Total Time spent with patient: 30 minutes  Past Psychiatric History: see admission H and P  Past Medical History:  Past Medical History  Diagnosis Date  . Asthma   . PE (pulmonary embolism)     Past Surgical History  Procedure Laterality Date  . Hernia repair    . Incision and drainage wound with foreign body removal     Family History:  Family History  Problem Relation Age of Onset  . Schizophrenia Father    Family Psychiatric  History: see admission H and P Social History:  History  Alcohol Use  . Yes    Comment: daily     History  Drug Use  . Yes  . Special: Marijuana    Social History   Social History  . Marital Status: Single    Spouse Name: N/A  . Number of Children: N/A  . Years of Education: N/A   Social History Main Topics   . Smoking status: Current Every Day Smoker -- 1.00 packs/day  . Smokeless tobacco: None  . Alcohol Use: Yes     Comment: daily  . Drug Use: Yes    Special: Marijuana  . Sexual Activity: Yes   Other Topics Concern  . None   Social History Narrative   Additional Social History:    Pain Medications: N/A  Prescriptions: N/A  Over the Counter: N/A  History of alcohol / drug use?: Yes Longest period of sobriety (when/how long): Unknown Negative Consequences of Use: Work / Programmer, multimedia, Armed forces operational officer, Surveyor, quantity Name of Substance 1: Alcohol  1 - Age of First Use: 21 1 - Amount (size/oz): 4-5 beers a day or 4-5 beers and 4-5 mixed drinks on a binge 1 - Frequency: daily  1 - Duration: unknown 1 - Last Use / Amount: 04/01/2015                  Sleep: Fair  Appetite:  Fair  Current Medications: Current Facility-Administered Medications  Medication Dose Route Frequency Provider Last Rate Last Dose  . acetaminophen (TYLENOL) tablet 650 mg  650 mg Oral Q6H PRN Charm Rings, NP   650 mg at 04/03/15 2146  . albuterol (PROVENTIL HFA;VENTOLIN HFA) 108 (90 BASE) MCG/ACT inhaler 1-2 puff  1-2 puff Inhalation Q4H  PRN Rachael FeeIrving A Arita Severtson, MD   2 puff at 04/04/15 1609  . alum & mag hydroxide-simeth (MAALOX/MYLANTA) 200-200-20 MG/5ML suspension 30 mL  30 mL Oral Q4H PRN Charm RingsJamison Y Lord, NP      . buPROPion (WELLBUTRIN XL) 24 hr tablet 150 mg  150 mg Oral Daily Rachael FeeIrving A Khara Renaud, MD   150 mg at 04/05/15 0845  . chlordiazePOXIDE (LIBRIUM) capsule 25 mg  25 mg Oral QID PRN Kerry HoughSpencer E Simon, PA-C   25 mg at 04/04/15 2156  . ibuprofen (ADVIL,MOTRIN) tablet 600 mg  600 mg Oral Q8H PRN Charm RingsJamison Y Lord, NP      . lamoTRIgine (LAMICTAL) tablet 50 mg  50 mg Oral BID Rachael FeeIrving A Millena Callins, MD   50 mg at 04/05/15 1620  . loratadine (CLARITIN) tablet 10 mg  10 mg Oral Daily Rachael FeeIrving A Faryn Sieg, MD   10 mg at 04/05/15 0845  . magnesium hydroxide (MILK OF MAGNESIA) suspension 30 mL  30 mL Oral Daily PRN Charm RingsJamison Y Lord, NP      . nicotine  (NICODERM CQ - dosed in mg/24 hours) patch 21 mg  21 mg Transdermal Daily Charm RingsJamison Y Lord, NP   21 mg at 04/05/15 0844  . ondansetron (ZOFRAN) tablet 4 mg  4 mg Oral Q8H PRN Charm RingsJamison Y Lord, NP      . pantoprazole (PROTONIX) EC tablet 40 mg  40 mg Oral Daily Charm RingsJamison Y Lord, NP   40 mg at 04/05/15 0845  . traZODone (DESYREL) tablet 50 mg  50 mg Oral QHS PRN Rachael FeeIrving A Gennesis Hogland, MD   50 mg at 04/04/15 2156    Lab Results: No results found for this or any previous visit (from the past 48 hour(s)).  Physical Findings: AIMS: Facial and Oral Movements Muscles of Facial Expression: None, normal Lips and Perioral Area: None, normal Jaw: None, normal Tongue: None, normal,Extremity Movements Upper (arms, wrists, hands, fingers): None, normal Lower (legs, knees, ankles, toes): None, normal, Trunk Movements Neck, shoulders, hips: None, normal, Overall Severity Severity of abnormal movements (highest score from questions above): None, normal Incapacitation due to abnormal movements: None, normal Patient's awareness of abnormal movements (rate only patient's report): No Awareness, Dental Status Current problems with teeth and/or dentures?: No Does patient usually wear dentures?: No  CIWA:  CIWA-Ar Total: 2 COWS:     Musculoskeletal: Strength & Muscle Tone: within normal limits Gait & Station: normal Patient leans: normal  Psychiatric Specialty Exam: Review of Systems  Constitutional: Positive for malaise/fatigue.  HENT: Negative.   Eyes: Negative.   Respiratory: Negative.   Cardiovascular: Negative.   Gastrointestinal: Negative.   Genitourinary: Negative.   Musculoskeletal: Positive for back pain.  Skin: Negative.   Neurological: Negative.   Endo/Heme/Allergies: Negative.   Psychiatric/Behavioral: Positive for depression, suicidal ideas and substance abuse. The patient is nervous/anxious.     Blood pressure 110/73, pulse 111, temperature 99.4 F (37.4 C), temperature source Oral, resp. rate  16, height 6' (1.829 m), weight 78.472 kg (173 lb), SpO2 100 %.Body mass index is 23.46 kg/(m^2).  General Appearance: Fairly Groomed  Patent attorneyye Contact::  Minimal  Speech:  Clear and Coherent and Slow  Volume:  Decreased  Mood:  Anxious and Depressed  Affect:  Depressed  Thought Process:  Coherent and Goal Directed  Orientation:  Full (Time, Place, and Person)  Thought Content:  symptoms events worries concerns  Suicidal Thoughts:  On and off  Homicidal Thoughts:  No  Memory:  Immediate;   Fair Recent;  Fair Remote;   Fair  Judgement:  Fair  Insight:  Present  Psychomotor Activity:  Decreased  Concentration:  Fair  Recall:  [employer] of Knowledge:Fair  Language: Fair  Akathisia:  No  Handed:  Right  AIMS (if indicated):     Assets:  Desire for Improvement Housing Talents/Skills Vocational/Educational  ADL's:  Intact  Cognition: WNL  Sleep:  Number of Hours: 6.5   Treatment Plan Summary: Daily contact with patient to assess and evaluate symptoms and progress in treatment and Medication management  Supportive approach/coping skills Alcohol dependence; continue to work a relapse prevention plan Depression; has tolerated the first dose of Wellbutrin today, will pursue further Mood instability; will continue the Lamictal 50 mg Insomnia; will continue the Trazodone 50 mg HS Will continue to help process the loss of the relationship with his fiancee and possibly his close friends.  Will use CBT/mindfulness  Cambre Matson A 04/05/2015, 5:02 PM

## 2015-04-05 NOTE — Progress Notes (Signed)
D- Patient reported feeling increased depression and anxiety over the idea of discharging.  Patient stated in the hospital he feels safe, but he has been thinking of ways to commit suicide out of the hospital.  When writer asked patient about his plan specifically patient stated "I can't tell you because then you won't let me out of here." Patient did disclose at times he engaged in risky behaviors, such as getting in his car and driving as fast as he can and just seeing what happens.  He also reported that he hopes the police will pull behind him and he would not stop so encourage them to shoot him.  Patient stated that he has have several serious suicide attempts including shooting himself with a gun.  He stated he didn't intend on killing himself that time, he just wanted to know that he could pull the trigger.  Patient stated "I know I can do it".  Patient is extremely anxious over the idea of returning home and returning to work.  Patient has many financial concerns that increase his anxiety and depression.  Patient reported that the only time he can remember being happy was at the beginning of his relationship with his ex-fiancee.  Now that the relationship has ended, patient reports that he has increased depression due to the loss of the relationship.  Patient reported using ecstasy, mollie, marijuana, and ETOH.  Patient reported being off medications for about a month and use of cocaine.  A-  Encouraged patient to list 3 coping skills to maintain sobriety, administer medications as prescribed and assess for effectiveness, monitor for side effects of medications, assessed patient for safety and self injurious behaviors, practiced coping skill with patient to help decrease anxiety.   R-  Patient attended groups including AA group on the unit.  Patient was able to list three coping skills to hep maintain sobriety, patient was compliant with all medications, patient did not report any side effects of  medications.  Patient endorses positive SI, but was able to contract for safety.

## 2015-04-05 NOTE — [facility] Group Notes (Signed)
[facility] LCSW Group Therapy  04/05/2015 4:16 PM  Type of Therapy:  Group Therapy  Participation Level:  Active  Participation Quality:  Attentive  Affect:  Depressed  Cognitive:  Oriented  Insight:  Improving  Engagement in Therapy:  Limited  Modes of Intervention:  Confrontation, Discussion, Education, Exploration, Problem-solving, Rapport Building, Socialization and Support  Summary of Progress/Problems:  Finding Balance in Life. Today's group focused on defining balance in one's own words, identifying things that can knock one off balance, and exploring healthy ways to maintain balance in life. Group members were asked to provide an example of a time when they felt off balance, describe how they handled that situation,and process healthier ways to regain balance in the future. Group members were asked to share the most important tool for maintaining balance that they learned [facility] and how they plan to apply this method after discharge. Jamie Hood was attentive and engaged during today's processing group. He shared that he felt offbalance due to mood fluctuations and depressed mood. "I don't want to d/c Saturday because of homecoming." Jamie Hood stated that he still deals with cravings and impulsive behavior. He shows improving insight AEB his ability to identify the trigger of "a major homecoming and party event" that would potentially lead to his relapse.   Smart, Tariyah Pendry LCSWA  04/05/2015, 4:16 PM

## 2015-04-06 DIAGNOSIS — F314 Bipolar disorder, current episode depressed, severe, without psychotic features: Principal | ICD-10-CM

## 2015-04-06 MED ORDER — TRAZODONE HCL 100 MG PO TABS
100.0000 mg | ORAL_TABLET | Freq: Every evening | ORAL | Status: DC | PRN
  Administered 2015-04-06: 50 mg via ORAL
  Administered 2015-04-07 – 2015-04-08 (×3): 100 mg via ORAL
  Filled 2015-04-06 (×3): qty 1

## 2015-04-06 MED ORDER — BACITRACIN-NEOMYCIN-POLYMYXIN OINTMENT TUBE
TOPICAL_OINTMENT | CUTANEOUS | Status: DC | PRN
  Administered 2015-04-07: 10:00:00 via TOPICAL
  Filled 2015-04-06: qty 15
  Filled 2015-04-06: qty 1

## 2015-04-06 MED ORDER — HYDROXYZINE HCL 25 MG PO TABS
25.0000 mg | ORAL_TABLET | Freq: Three times a day (TID) | ORAL | Status: DC | PRN
  Administered 2015-04-06 – 2015-04-09 (×5): 25 mg via ORAL
  Filled 2015-04-06 (×5): qty 1

## 2015-04-06 NOTE — Progress Notes (Addendum)
Patient ID: Jamie Hood, male   DOB: [birth year], 29 y.o.   MRN: [id number] [facility] MD Progress Note  04/06/2015 5:21 PM Jamie Hood  MRN:  [id number] Subjective:  Continues to report depression and subjective sense of anxiety, but does acknowledge some improvement compared to admission. At this time denies any medication side effects. Denies any current symptoms of alcohol WDL. Objective : Patient case discussed with treatment team and patient seen. Patient is a 29 year old man, single, has a young son who resides with mother in [place], currently is on short term disability. Reports history of depression and of alcohol abuse . As per chart, has been diagnosed with Bipolar Disorder. Currently presents depressed, mildly constricted in affect. Reports partial improvement compared to admission. He is future oriented, and states he plans to return to work in the near future . As noted, currently not presenting with any symptoms of alcohol withdrawal- no tremors, no diaphoresis, no restlessness or agitation. Denies medication side effects. Somewhat isolative on unit, no disruptive or agitated behaviors on unit. Principal Problem: Bipolar affective disorder, depressed, severe (HCC) Diagnosis:   Patient Active Problem List   Diagnosis Date Noted  . Bipolar affective disorder, depressed, severe (HCC) [F31.4] 04/02/2015  . Alcohol abuse [F10.10] 04/02/2015   Total Time spent with patient:  25 minutes   Past Psychiatric History: see admission H and P  Past Medical History:  Past Medical History  Diagnosis Date  . Asthma   . PE (pulmonary embolism)     Past Surgical History  Procedure Laterality Date  . Hernia repair    . Incision and drainage wound with foreign body removal     Family History:  Family History  Problem Relation Age of Onset  . Schizophrenia Father    Family Psychiatric  History: see admission H and P Social History:  History  Alcohol Use  . Yes    Comment: daily    History  Drug Use  . Yes  . Special: Marijuana    Social History   Social History  . Marital Status: Single    Spouse Name: N/A  . Number of Children: N/A  . Years of Education: N/A   Social History Main Topics  . Smoking status: Current Every Day Smoker -- 1.00 packs/day  . Smokeless tobacco: None  . Alcohol Use: Yes     Comment: daily  . Drug Use: Yes    Special: Marijuana  . Sexual Activity: Yes   Other Topics Concern  . None   Social History Narrative   Additional Social History:    Pain Medications: N/A  Prescriptions: N/A  Over the Counter: N/A  History of alcohol / drug use?: Yes Longest period of sobriety (when/how long): Unknown Negative Consequences of Use: Work / Programmer, multimedia, Armed forces operational officer, Surveyor, quantity Name of Substance 1: Alcohol  1 - Age of First Use: 21 1 - Amount (size/oz): 4-5 beers a day or 4-5 beers and 4-5 mixed drinks on a binge 1 - Frequency: daily  1 - Duration: unknown 1 - Last Use / Amount: 04/01/2015  Sleep:  Improved   Appetite:  Fair  Current Medications: Current Facility-Administered Medications  Medication Dose Route Frequency Provider Last Rate Last Dose  . acetaminophen (TYLENOL) tablet 650 mg  650 mg Oral Q6H PRN Charm Rings, NP   650 mg at 04/03/15 2146  . albuterol (PROVENTIL HFA;VENTOLIN HFA) 108 (90 BASE) MCG/ACT inhaler 1-2 puff  1-2 puff Inhalation Q4H PRN Rachael Fee, MD  2 puff at 04/04/15 1609  . alum & mag hydroxide-simeth (MAALOX/MYLANTA) 200-200-20 MG/5ML suspension 30 mL  30 mL Oral Q4H PRN Charm Rings, NP      . buPROPion (WELLBUTRIN XL) 24 hr tablet 150 mg  150 mg Oral Daily Rachael Fee, MD   150 mg at 04/06/15 1610  . chlordiazePOXIDE (LIBRIUM) capsule 25 mg  25 mg Oral QID PRN Kerry Hough, PA-C   25 mg at 04/06/15 1203  . ibuprofen (ADVIL,MOTRIN) tablet 600 mg  600 mg Oral Q8H PRN Charm Rings, NP      . lamoTRIgine (LAMICTAL) tablet 50 mg  50 mg Oral BID Rachael Fee, MD   50 mg at 04/06/15 1706  .  loratadine (CLARITIN) tablet 10 mg  10 mg Oral Daily Rachael Fee, MD   10 mg at 04/06/15 9604  . magnesium hydroxide (MILK OF MAGNESIA) suspension 30 mL  30 mL Oral Daily PRN Charm Rings, NP      . neomycin-bacitracin-polymyxin (NEOSPORIN) ointment   Topical PRN Shuvon B Rankin, NP      . nicotine (NICODERM CQ - dosed in mg/24 hours) patch 21 mg  21 mg Transdermal Daily Charm Rings, NP   21 mg at 04/06/15 0839  . ondansetron (ZOFRAN) tablet 4 mg  4 mg Oral Q8H PRN Charm Rings, NP      . pantoprazole (PROTONIX) EC tablet 40 mg  40 mg Oral Daily Charm Rings, NP   40 mg at 04/06/15 5409  . traZODone (DESYREL) tablet 50 mg  50 mg Oral QHS PRN Rachael Fee, MD   50 mg at 04/05/15 2311    Lab Results: No results found for this or any previous visit (from the past 48 hour(s)).  Physical Findings: AIMS: Facial and Oral Movements Muscles of Facial Expression: None, normal Lips and Perioral Area: None, normal Jaw: None, normal Tongue: None, normal,Extremity Movements Upper (arms, wrists, hands, fingers): None, normal Lower (legs, knees, ankles, toes): None, normal, Trunk Movements Neck, shoulders, hips: None, normal, Overall Severity Severity of abnormal movements (highest score from questions above): None, normal Incapacitation due to abnormal movements: None, normal Patient's awareness of abnormal movements (rate only patient's report): No Awareness, Dental Status Current problems with teeth and/or dentures?: No Does patient usually wear dentures?: No  CIWA:  CIWA-Ar Total: 2 COWS:     Musculoskeletal: Strength & Muscle Tone: within normal limits Gait & Station: normal Patient leans: normal  Psychiatric Specialty Exam: Review of Systems  Constitutional: Positive for malaise/fatigue.  HENT: Negative.   Eyes: Negative.   Respiratory: Negative.   Cardiovascular: Negative.   Gastrointestinal: Negative.   Genitourinary: Negative.   Musculoskeletal: Negative.   Skin:  Negative.   Neurological: Negative.   Endo/Heme/Allergies: Negative.   Psychiatric/Behavioral: Positive for depression and substance abuse. The patient is nervous/anxious.     Blood pressure 100/68, pulse 74, temperature 98.2 F (36.8 C), temperature source Oral, resp. rate 18, height 6' (1.829 m), weight 173 lb (78.472 kg), SpO2 100 %.Body mass index is 23.46 kg/(m^2).  General Appearance: Well Groomed  Patent attorney::  Good  Speech:  Clear and Coherent  Volume:  Normal  Mood:   Depressed but partial improvement compared to admission  Affect:  Constricted, does smile briefly at times   Thought Process:  Coherent and Goal Directed  Orientation:  Full (Time, Place, and Person)  Thought Content:  Denies hallucinations, no delusions expressed, does not appear internally preoccupied  Suicidal Thoughts:  No- at this time denies any self injurious ideations or any suicidal ideations, contracts for safety on the unit   Homicidal Thoughts:  No  Memory:  Recent and remote grossly intact   Judgement:   improved  Insight:   Fair   Psychomotor Activity:  Decreased  Concentration:  Good  Recall:  Good  Fund of Knowledge:Good  Language: Good  Akathisia:  No  Handed:  Right  AIMS (if indicated):     Assets:  Desire for Improvement Housing Social Support Talents/Skills  ADL's:  Intact  Cognition: WNL  Sleep:  Number of Hours: 6.5  Assessment - patient remains depressed, but does endorse some partial improvement compared to how he felt on admission. At this time not presenting with any alcohol withdrawal symptoms. Tolerating medications well. Denies any current SI or any current psychotic symptoms . Treatment Plan Summary: Daily contact with patient to assess and evaluate symptoms and progress in treatment and Medication management Supportive approach/coping skills Alcohol abuse; continue to work a relapse prevention plan Continue  Lamictal 50 mg BID for mood disorder  Continue  Wellbutrin XL  150 mg QAM for depression Continue Trazodone 50 mgrs QHS PRN for management of insomnia  Continue Librium 25 mgrs Q 6 hours PRN for possible  alcohol withdrawal symptoms, if needed Encourage increased milieu participation to work on symptom reduction and coping skills Repeat CBC to follow up on low WBC  COBOS, FERNANDO 04/06/2015, 5:21 PM

## 2015-04-06 NOTE — [facility] Suicide Risk Assessment (Signed)
[facility] INPATIENT:  Family/Significant Other Suicide Prevention Education  Suicide Prevention Education:  Education Completed: Paulette Reid-King (pt's mother) [phone] has been identified by the patient as the family member/significant other with whom the patient will be residing, and identified as the person(s) who will aid the patient in the event of a mental health crisis (suicidal ideations/suicide attempt).  With written consent from the patient, the family member/significant other has been provided the following suicide prevention education, prior to the and/or following the discharge of the patient.  The suicide prevention education provided includes the following:  Suicide risk factors  Suicide prevention and interventions  National Suicide Hotline telephone number  [facility] assessment telephone number  [facility] Emergency Assistance 911  [facility] and/or Residential Mobile Crisis Unit telephone number  Request made of family/significant other to:  Remove weapons (e.g., guns, rifles, knives), all items previously/currently identified as safety concern.    Remove drugs/medications (over-the-counter, prescriptions, illicit drugs), all items previously/currently identified as a safety concern.  The family member/significant other verbalizes understanding of the suicide prevention education information provided.  The family member/significant other agrees to remove the items of safety concern listed above.  Smart, Uzair Godley LCSWA  04/06/2015, 2:42 PM

## 2015-04-06 NOTE — Progress Notes (Signed)
D.  Pt pleasant but very anxious on approach, states that he feels his medication dosages need to be increased and states that he spoke to the doctor about this earlier.  Positive for evening AA group, interacting appropriately with peers on the unit.  Denies SI/HI/hallucinations at this time.  A.  Support and encouragement offered, called NP to request orders for increased medication for sleep as well as additional medication for anxiety.  New orders received.  R.  Pt pleased with new orders.  Pt remains safe on unit, will continue to monitor.

## 2015-04-06 NOTE — Progress Notes (Signed)
Patient did attend the evening speaker AA meeting.  

## 2015-04-06 NOTE — [facility] Group Notes (Signed)
[facility] L[facility] Discharge Planning Group Note   04/06/2015 9:47 AM  Participation Quality:  Appropriate   Mood/Affect:  Appropriate  Depression Rating:  7-8  Anxiety Rating:  7-8  Thoughts of Suicide:  No Will you contract for safety?   NA  Current AVH:  No  Plan for Discharge/Comments:  Pt reports that depression is still high " and I'm not sleeping at all." Pt brightens when talking. Pt reports increased mood instability/frustration. [facility] He plans to return home with his mother. Tentative d/c on Monday.   Transportation Means: mother  Supports: mother  Smart, [name] LCSWA

## 2015-04-06 NOTE — Tx Team (Signed)
Interdisciplinary Treatment Plan Update (Adult)  Date:  04/06/2015  Time Reviewed:  10:03 AM   Progress in Treatment: Attending groups: Yes  Participating in groups:  Yes  Taking medication as prescribed:  Yes. Tolerating medication:  Yes. Family/Significant othe contact made:  SPE required for this pt.  Patient understands diagnosis:  Yes.AEB seeking treatment for depression, passive SI, ETOh abuse, and for medication stabilization.  Discussing patient identified problems/goals with staff:  Yes. Medical problems stabilized or resolved:  Yes. Denies suicidal/homicidal ideation: Yes, during group/self report.  Issues/concerns per patient self-inventory:  Other  Discharge Plan or Barriers: CSW assessing for appropriate referrals. Pt has not gotten out of bed at this point to meet with MD/CSW or to complete PSA.   Reason for Continuation of Hospitalization: Depression/mood instability Medication management   Comments:  29 y/o male who c/o etoh abuse, anxiety, depression and SI. Patient states, "My drinking has gotten out of control." Patient states he has been drinking every day 6-7 beers a day and sometimes more. Patient states he was here at [facility] earlier this year. Patient states he has just recently gotten back on his psychotropic medications and has been compliant. Patient states he is recently broke up with his fiance and had to move back to the area. Patient states he recently got a DUI and has a court date this week. Patient states, "I am ready to stop drinking because it is ruining my life." Patient states he is currently on disability and he is bored and he has nothing to do during the day. Patient states all of his friends drinking alcohol and smoke weed. Patient states he is currently passive SI but verbally contracts for safety. Patient denies HI and states he has auditory hallucinations saying negative things. Patient denies physical, sexual, and verbal  abuse.  Estimated length of stay:  3 days (tentative Mon D/c)   Additional Comments:  Patient and CSW reviewed pt's identified goals and treatment plan. Patient verbalized understanding and agreed to treatment plan. CSW reviewed [facility] "Discharge Process and Patient Involvement" Form. Pt verbalized understanding of information provided and signed form.    Review of initial/current patient goals per problem list:  1. Goal(s): Patient will participate in aftercare plan  Met: Yes  Target date: at discharge  As evidenced by: Patient will participate within aftercare plan AEB aftercare provider and housing plan at discharge being identified.  10/25: CSW assessing for appropriate referrals.   10/28: Pt plans to return home with mom/followup in place at the [place].   2. Goal (s): Patient will exhibit decreased depressive symptoms and suicidal ideations.  Met: No.    Target date: at discharge  As evidenced by: Patient will utilize self rating of depression at 3 or below and demonstrate decreased signs of depression or be deemed stable for discharge by MD.  10/25: Pt rates depression as high and endorses passive Si/Able to contract for safety on the unit.   10/28: Pt rates depression as 7-8 and presents with depressed mood/flat affect. Denies SI/HI/AVH.   3. Goal(s): Patient will demonstrate decreased signs of withdrawal due to substance abuse  Met:Yes   Target date:at discharge   As evidenced by: Patient will produce a CIWA/COWS score of 0, have stable vitals signs, and no symptoms of withdrawal.  10/25: Pt reports mild withdrawals with CIWA score of 2 and stable vitals.   10/28: Pt reports no signs of withdrawal CIWA score of 0 and stable vitals.   Attendees: Patient:  04/06/2015 10:03 AM   Family:   04/06/2015 10:03 AM   Physician:  Dr. Carlton Adam, MD 04/06/2015 10:03 AM   Nursing:   Elio Forget RN  04/06/2015 10:03 AM   Clinical Social Worker:  Maxie Better, [place]  04/06/2015 10:03 AM   Clinical Social Worker: Erasmo Downer Drinkard LCSWA; Peri Maris LCSWA 04/06/2015 10:03 AM   Other:  Gerline Legacy Nurse Case Manager 04/06/2015 10:03 AM   Other:  Lucinda Dell; [facility]  04/06/2015 10:03 AM   Other:   04/06/2015 10:03 AM   Other:  04/06/2015 10:03 AM   Other:  04/06/2015 10:03 AM   Other:  04/06/2015 10:03 AM    04/06/2015 10:03 AM    04/06/2015 10:03 AM    04/06/2015 10:03 AM    04/06/2015 10:03 AM    Scribe for Treatment Team:   Maxie Better, [place]  04/06/2015 10:03 AM

## 2015-04-06 NOTE — Progress Notes (Signed)
D. Pt had been up and visible in milieu this evening, did attend and participate in evening group activity. Pt did endorse feeling depressed and has appeared depressed while in the milieu this evening. Pt complain of anxiety and did request and receive medication to help. Pt was seen interacting appropriately with peers in the milieu. A. Support and encouragement provided. R. Safety maintained, will continue to monitor.

## 2015-04-06 NOTE — Progress Notes (Signed)
NSG 7a-7p shift:   D:  Pt. Has been very anxious and b this shift.  Pt's Goal today is *   A: Support, education, and encouragement provided as needed.  Level 3 checks continued for safety.  R: Pt.  * receptive to intervention/s.  Safety maintained.  Joaquin MusicMary Ariza Evans, RN

## 2015-04-06 NOTE — [facility] Group Notes (Signed)
[facility] LCSW Group Therapy  04/06/2015 1:05 PM  Type of Therapy:  Group Therapy  Participation Level:  Did Not Attend-pt invited/chose to remain in bed.   Modes of Intervention:  Confrontation, Discussion, Education, Exploration, Problem-solving, Rapport Building, Socialization and Support  Summary of Progress/Problems: Feelings around Relapse. Group members discussed the meaning of relapse and shared personal stories of relapse, how it affected them and others, and how they perceived themselves during this time. Group members were encouraged to identify triggers, warning signs and coping skills used when facing the possibility of relapse. Social supports were discussed and explored in detail. Post Acute Withdrawal Syndrome (handout provided) was introduced and examined. Pt's were encouraged to ask questions, talk about key points associated with PAWS, and process this information in terms of relapse prevention.   Smart, Krystyl Cannell LCSWA 04/06/2015, 1:05 PM

## 2015-04-07 DIAGNOSIS — F1010 Alcohol abuse, uncomplicated: Secondary | ICD-10-CM

## 2015-04-07 LAB — CBC WITH DIFFERENTIAL/PLATELET
BASOS ABS: 0 10*3/uL (ref 0.0–0.1)
Basophils Relative: 0 %
Eosinophils Absolute: 0.2 10*3/uL (ref 0.0–0.7)
Eosinophils Relative: 4 %
HEMATOCRIT: 40.6 % (ref 39.0–52.0)
HEMOGLOBIN: 13.2 g/dL (ref 13.0–17.0)
LYMPHS ABS: 1.7 10*3/uL (ref 0.7–4.0)
LYMPHS PCT: 36 %
MCH: 28.2 pg (ref 26.0–34.0)
MCHC: 32.5 g/dL (ref 30.0–36.0)
MCV: 86.8 fL (ref 78.0–100.0)
Monocytes Absolute: 0.4 10*3/uL (ref 0.1–1.0)
Monocytes Relative: 8 %
NEUTROS ABS: 2.4 10*3/uL (ref 1.7–7.7)
NEUTROS PCT: 52 %
PLATELETS: 126 10*3/uL — AB (ref 150–400)
RBC: 4.68 MIL/uL (ref 4.22–5.81)
RDW: 14 % (ref 11.5–15.5)
WBC: 4.7 10*3/uL (ref 4.0–10.5)

## 2015-04-07 MED ORDER — GABAPENTIN 100 MG PO CAPS
200.0000 mg | ORAL_CAPSULE | Freq: Two times a day (BID) | ORAL | Status: DC
  Administered 2015-04-07 – 2015-04-09 (×4): 200 mg via ORAL
  Filled 2015-04-07 (×9): qty 2

## 2015-04-07 MED ORDER — BENZOCAINE-ZINC CHLORIDE 20-0.1 % MT GEL
1.0000 "application " | Freq: Four times a day (QID) | OROMUCOSAL | Status: DC | PRN
  Filled 2015-04-07: qty 2

## 2015-04-07 MED ORDER — BENZOCAINE 20 % MT LIQD
1.0000 "application " | Freq: Four times a day (QID) | OROMUCOSAL | Status: DC | PRN
  Administered 2015-04-07: 1 via OROMUCOSAL

## 2015-04-07 MED ORDER — BENZOCAINE 20 % MT SOLN
Freq: Four times a day (QID) | OROMUCOSAL | Status: DC | PRN
  Filled 2015-04-07: qty 57

## 2015-04-07 MED ORDER — BENZOCAINE 20 % MT GEL: Freq: Four times a day (QID) | OROMUCOSAL | Status: DC | PRN

## 2015-04-07 MED ORDER — GABAPENTIN 100 MG PO CAPS
200.0000 mg | ORAL_CAPSULE | Freq: Once | ORAL | Status: AC
  Administered 2015-04-07: 200 mg via ORAL
  Filled 2015-04-07 (×2): qty 2

## 2015-04-07 MED ORDER — GABAPENTIN 100 MG PO CAPS
200.0000 mg | ORAL_CAPSULE | Freq: Two times a day (BID) | ORAL | Status: DC
  Filled 2015-04-07 (×2): qty 2

## 2015-04-07 NOTE — [facility] Group Notes (Signed)
[facility] Group Notes:  (Nursing/MHT/Case Management/Adjunct)  Date:  04/07/2015  Time:  4:00 PM  Type of Therapy:  Psychoeducational Skills  Participation Level:  Active  Participation Quality:  Appropriate  Affect:  Appropriate  Cognitive:  Appropriate  Insight:  Appropriate  Engagement in Group:  Engaged  Modes of Intervention:  Discussion  Summary of Progress/Problems: Pt did attend self inventory group, pt reported that he was negative SI/HI, no AH/VH noted. Pt rated his depression as a 6, his anxiety as a 7, and his helplessness/hopelessness as a 3.     Pt reported concerns or issues.   Jacquelyne BalintForrest, Adelin Ventrella Shanta 04/07/2015, 4:00 PM

## 2015-04-07 NOTE — Plan of Care (Signed)
Problem: Alteration in mood & ability to function due to Goal: STG-Patient will attend groups Outcome: Progressing Pt has attended all groups weekend evenings

## 2015-04-07 NOTE — Progress Notes (Signed)
Patient ID: Jamie RicksBradley Kreuser, male   DOB: 06/05/1986, 29 y.o.   MRN: [id number]   D: Pt has been very flat and depressed on the unit today. Pt did attend all groups and engaged in treatment. Pt reported that he was upset because the doctors were not addressing any of his issues or concerns, pt reported that he needed his medication adjusted. Pt reported that he was very anxious and that he was still was having problems with mood swings. The doctor was made aware, new medications orders were noted. Pt reported that his depression was a 4, his hopelessness was a 5, and his anxiety was a 6. Pt reported that his goal for today was to go home. Pt reported being negative SI/HI, no AH/VH noted. A: 15 min checks continued for patient safety. R: Pt safety maintained.

## 2015-04-07 NOTE — [facility] Group Notes (Signed)
[facility] Group Notes: (Clinical Social Work)   04/07/2015      Type of Therapy:  Group Therapy   Participation Level:  Did Not Attend despite MHT prompting   Ambrose MantleMareida Grossman-Orr, LCSW 04/07/2015, 12:14 PM

## 2015-04-07 NOTE — Progress Notes (Signed)
D.  Pt pleasant on approach, denies complaints at this time.  Positive for evening wrap up group, interacting appropriately with peers on the unit.  Explained medications changes to Pt and Pt verbalized understanding.  Denies SI/HI/hallucinations at this time.  A.  Support and encouragement offered  R.  Pt remains safe on unit, will continue to monitor.

## 2015-04-07 NOTE — Progress Notes (Signed)
Medication education on neurontin done. Patient verbalized understanding.

## 2015-04-07 NOTE — Progress Notes (Signed)
Patient ID: Jamie Hood, male   DOB: 01-31-1986, 29 y.o.   MRN: [id number] [facility] MD Progress Note  04/07/2015 12:04 PM Jamie Hood  MRN:  [id number] Subjective:  Continues to report some depression and subjective sense of anxiety,  At this time denies any medication side effects. Denies any current symptoms of alcohol WDL. He feels medication need to be increased as moodiness is there and anxiety during the day Objective : Patient case discussed with treatment team and patient seen. Patient is a 29 year old man, single, has a young son who resides with mother in [place], currently is on short term disability. Reports history of depression and of alcohol abuse . As per chart, has been diagnosed with Bipolar Disorder. Currently presents with depressed, mildly constricted in affect. Reports partial improvement compared to admission. He is future oriented, and states he plans to return to work in the near future . As noted, currently not presenting with any symptoms of alcohol withdrawal- no tremors, no diaphoresis, no restlessness or agitation. Denies medication side effects. Somewhat isolative on unit, no disruptive or agitated behaviors on unit. Principal Problem: Bipolar affective disorder, depressed, severe (HCC) Diagnosis:   Patient Active Problem List   Diagnosis Date Noted  . Bipolar affective disorder, depressed, severe (HCC) [F31.4] 04/02/2015  . Alcohol abuse [F10.10] 04/02/2015   Total Time spent with patient:  25 minutes   Past Psychiatric History: see admission H and P  Past Medical History:  Past Medical History  Diagnosis Date  . Asthma   . PE (pulmonary embolism)     Past Surgical History  Procedure Laterality Date  . Hernia repair    . Incision and drainage wound with foreign body removal     Family History:  Family History  Problem Relation Age of Onset  . Schizophrenia Father    Family Psychiatric  History: see admission H and P Social History:  History   Alcohol Use  . Yes    Comment: daily     History  Drug Use  . Yes  . Special: Marijuana    Social History   Social History  . Marital Status: Single    Spouse Name: N/A  . Number of Children: N/A  . Years of Education: N/A   Social History Main Topics  . Smoking status: Current Every Day Smoker -- 1.00 packs/day  . Smokeless tobacco: None  . Alcohol Use: Yes     Comment: daily  . Drug Use: Yes    Special: Marijuana  . Sexual Activity: Yes   Other Topics Concern  . None   Social History Narrative   Additional Social History:    Pain Medications: N/A  Prescriptions: N/A  Over the Counter: N/A  History of alcohol / drug use?: Yes Longest period of sobriety (when/how long): Unknown Negative Consequences of Use: Work / Programmer, multimedia, Armed forces operational officer, Surveyor, quantity Name of Substance 1: Alcohol  1 - Age of First Use: 21 1 - Amount (size/oz): 4-5 beers a day or 4-5 beers and 4-5 mixed drinks on a binge 1 - Frequency: daily  1 - Duration: unknown 1 - Last Use / Amount: 04/01/2015  Sleep:  Improved   Appetite:  Fair  Current Medications: Current Facility-Administered Medications  Medication Dose Route Frequency Provider Last Rate Last Dose  . acetaminophen (TYLENOL) tablet 650 mg  650 mg Oral Q6H PRN Charm Rings, NP   650 mg at 04/03/15 2146  . albuterol (PROVENTIL HFA;VENTOLIN HFA) 108 (90 BASE) MCG/ACT inhaler 1-2  puff  1-2 puff Inhalation Q4H PRN Rachael Fee, MD   2 puff at 04/04/15 1609  . alum & mag hydroxide-simeth (MAALOX/MYLANTA) 200-200-20 MG/5ML suspension 30 mL  30 mL Oral Q4H PRN Charm Rings, NP      . BENZOCAINE (DENTAL) 20 % LIQD 1 application  1 application Mouth/Throat QID PRN Rachael Fee, MD   1 application at 04/07/15 [phone]  . buPROPion (WELLBUTRIN XL) 24 hr tablet 150 mg  150 mg Oral Daily Rachael Fee, MD   150 mg at 04/07/15 0911  . chlordiazePOXIDE (LIBRIUM) capsule 25 mg  25 mg Oral QID PRN Kerry Hough, PA-C   25 mg at 04/07/15 0916  . gabapentin  (NEURONTIN) capsule 200 mg  200 mg Oral Once Thresa Ross, MD      . gabapentin (NEURONTIN) capsule 200 mg  200 mg Oral BID Thresa Ross, MD      . hydrOXYzine (ATARAX/VISTARIL) tablet 25 mg  25 mg Oral TID PRN Worthy Flank, NP   25 mg at 04/07/15 0916  . ibuprofen (ADVIL,MOTRIN) tablet 600 mg  600 mg Oral Q8H PRN Charm Rings, NP      . lamoTRIgine (LAMICTAL) tablet 50 mg  50 mg Oral BID Rachael Fee, MD   50 mg at 04/07/15 0911  . loratadine (CLARITIN) tablet 10 mg  10 mg Oral Daily Rachael Fee, MD   10 mg at 04/07/15 0912  . magnesium hydroxide (MILK OF MAGNESIA) suspension 30 mL  30 mL Oral Daily PRN Charm Rings, NP      . neomycin-bacitracin-polymyxin (NEOSPORIN) ointment   Topical PRN Shuvon B Rankin, NP      . nicotine (NICODERM CQ - dosed in mg/24 hours) patch 21 mg  21 mg Transdermal Daily Charm Rings, NP   21 mg at 04/07/15 0911  . ondansetron (ZOFRAN) tablet 4 mg  4 mg Oral Q8H PRN Charm Rings, NP      . pantoprazole (PROTONIX) EC tablet 40 mg  40 mg Oral Daily Charm Rings, NP   40 mg at 04/07/15 0911  . traZODone (DESYREL) tablet 100 mg  100 mg Oral QHS PRN Worthy Flank, NP   50 mg at 04/06/15 2201    Lab Results: No results found for this or any previous visit (from the past 48 hour(s)).  Physical Findings: AIMS: Facial and Oral Movements Muscles of Facial Expression: None, normal Lips and Perioral Area: None, normal Jaw: None, normal Tongue: None, normal,Extremity Movements Upper (arms, wrists, hands, fingers): None, normal Lower (legs, knees, ankles, toes): None, normal, Trunk Movements Neck, shoulders, hips: None, normal, Overall Severity Severity of abnormal movements (highest score from questions above): None, normal Incapacitation due to abnormal movements: None, normal Patient's awareness of abnormal movements (rate only patient's report): No Awareness, Dental Status Current problems with teeth and/or dentures?: No Does patient usually wear  dentures?: No  CIWA:  CIWA-Ar Total: 5 COWS:     Musculoskeletal: Strength & Muscle Tone: within normal limits Gait & Station: normal Patient leans: normal  Psychiatric Specialty Exam: Review of Systems  Constitutional: Positive for malaise/fatigue. Negative for fever.  Cardiovascular: Negative for chest pain.  Psychiatric/Behavioral: Positive for depression and substance abuse. The patient is nervous/anxious.     Blood pressure 114/69, pulse 68, temperature 97.9 F (36.6 C), temperature source Oral, resp. rate 16, height 6' (1.829 m), weight 78.472 kg (173 lb), SpO2 100 %.Body mass index is 23.46 kg/(m^2).  General Appearance: Well Groomed  Patent attorneyye Contact::  Good  Speech:  Clear and Coherent  Volume:  Normal  Mood:   Depressed but partial improvement compared to admission  Affect:  Constricted, does smile briefly at times   Thought Process:  Coherent and Goal Directed  Orientation:  Full (Time, Place, and Person)  Thought Content:  Denies hallucinations, no delusions expressed, does not appear internally preoccupied   Suicidal Thoughts:  No- at this time denies any self injurious ideations or any suicidal ideations, contracts for safety on the unit   Homicidal Thoughts:  No  Memory:  Recent and remote grossly intact   Judgement:   improved  Insight:   Fair   Psychomotor Activity:  Decreased  Concentration:  Good  Recall:  Good  Fund of Knowledge:Good  Language: Good  Akathisia:  No  Handed:  Right  AIMS (if indicated):     Assets:  Desire for Improvement Housing Social Support Talents/Skills  ADL's:  Intact  Cognition: WNL  Sleep:  Number of Hours: 5.75  Assessment - patient remains depressed, but does endorse some partial improvement compared to how he felt on admission. At this time not presenting with any alcohol withdrawal symptoms. Tolerating medications well. Denies any current SI or any current psychotic symptoms . Treatment Plan Summary: Daily contact with  patient to assess and evaluate symptoms and progress in treatment and Medication management Supportive approach/coping skills Alcohol abuse; continue to work a relapse prevention plan Bipolar : increase l Lamictal 50 mg BID for mood disorder  Add neurontin for anxiety and as mood stabilizer. 200mg  bid Continue  Wellbutrin XL 150 mg QAM for depression Continue Trazodone 50 mgrs QHS PRN for management of insomnia  Continue Librium 25 mgrs Q 6 hours PRN for possible  alcohol withdrawal symptoms, if needed Encourage increased milieu participation to work on symptom reduction and coping skills Repeat CBC to follow up on low WBC  Travis Purk 04/07/2015, 12:04 PM

## 2015-04-08 NOTE — [facility] Group Notes (Signed)
[facility] Group Notes:  (Nursing/MHT/Case Management/Adjunct)  Date:  04/08/2015  Time:  1000  Type of Therapy:  Nurse Education - Healthy Support Systems  Participation Level:  Active  Participation Quality:  Attentive  Affect:  Appropriate  Cognitive:  Appropriate  Insight:  Improving  Engagement in Group:  Engaged  Modes of Intervention:  Discussion and Education  Summary of Progress/Problems: Patient identified that his mom is there for him however she wants him to do things her way.  Merian CapronFriedman, Kailie Polus [facility] 04/08/2015, [id number]

## 2015-04-08 NOTE — Progress Notes (Signed)
D:Affect is appropriate to mood,anxious at times requiring PRN meds. States that he wants to work on "gathering my thoughts" today. Says he has had a lot on his mind and is having some difficulty with focusing. A:Support and encouragement offered. R:Receptive. No complaints of pain at this time.

## 2015-04-08 NOTE — Progress Notes (Signed)
[facility] Group Notes:  (Nursing/MHT/Case Management/Adjunct)  Date:  04/08/2015  Time:  1:21 AM  Type of Therapy:  Psychoeducational Skills  Participation Level:  Active  Participation Quality:  Appropriate  Affect:  Appropriate  Cognitive:  Appropriate  Insight:  Good  Engagement in Group:  Engaged  Modes of Intervention:  Education  Summary of Progress/Problems: The patient shared in group last evening that he had a good day overall despite having a rough start. The patient admits to having struggled with anxiety issues and that he was pleased with the fact that his medications were adjusted to his liking. He also states that he feels more "functional" at this time. In terms of the theme for the day, his coping skill will be to "stay active" and to work on finding "new habits".   Ulysse Siemen S 04/08/2015, 1:21 AM

## 2015-04-08 NOTE — Progress Notes (Signed)
Patient ID: Jamie Hood, male   DOB: [birth year], 29 y.o.   MRN: [id number] [facility] MD Progress Note  04/08/2015 11:22 AM Jamie Hood  MRN:  [id number] Subjective:  Continues to report some depression and subjective  anxiety,  At this time denies any medication side effects. Denies any current symptoms of alcohol WDL.  Objective : Patient case discussed with treatment team and patient seen. Patient is a 29 year old man, single, has a young son who resides with mother in [place], currently is on short term disability. Reports history of depression and of alcohol abuse . As per chart, has been diagnosed with Bipolar Disorder. nuerontin was added yesterday and lamictal was increaed.  Mood somewhat improved but some sedation noticed, says he does not want to lower the meds.  No psychotic symptoms Currently presents with depressed, mildly constricted in affect. Reports partial improvement compared to admission. He is future oriented, and states he plans to return to work in the near future . As noted, currently not presenting with any symptoms of alcohol withdrawal- no tremors, no diaphoresis, no restlessness or agitation. Denies medication side effects. Somewhat isolative on unit, no disruptive or agitated behaviors on unit. Principal Problem: Bipolar affective disorder, depressed, severe (HCC) Diagnosis:   Patient Active Problem List   Diagnosis Date Noted  . Bipolar affective disorder, depressed, severe (HCC) [F31.4] 04/02/2015  . Alcohol abuse [F10.10] 04/02/2015   Total Time spent with patient:  25 minutes   Past Psychiatric History: see admission H and P  Past Medical History:  Past Medical History  Diagnosis Date  . Asthma   . PE (pulmonary embolism)     Past Surgical History  Procedure Laterality Date  . Hernia repair    . Incision and drainage wound with foreign body removal     Family History:  Family History  Problem Relation Age of Onset  . Schizophrenia Father     Family Psychiatric  History: see admission H and P Social History:  History  Alcohol Use  . Yes    Comment: daily     History  Drug Use  . Yes  . Special: Marijuana    Social History   Social History  . Marital Status: Single    Spouse Name: N/A  . Number of Children: N/A  . Years of Education: N/A   Social History Main Topics  . Smoking status: Current Every Day Smoker -- 1.00 packs/day  . Smokeless tobacco: None  . Alcohol Use: Yes     Comment: daily  . Drug Use: Yes    Special: Marijuana  . Sexual Activity: Yes   Other Topics Concern  . None   Social History Narrative   Additional Social History:    Pain Medications: N/A  Prescriptions: N/A  Over the Counter: N/A  History of alcohol / drug use?: Yes Longest period of sobriety (when/how long): Unknown Negative Consequences of Use: Work / Programmer, multimedia, Armed forces operational officer, Surveyor, quantity Name of Substance 1: Alcohol  1 - Age of First Use: 21 1 - Amount (size/oz): 4-5 beers a day or 4-5 beers and 4-5 mixed drinks on a binge 1 - Frequency: daily  1 - Duration: unknown 1 - Last Use / Amount: 04/01/2015  Sleep:  Improved   Appetite:  Fair  Current Medications: Current Facility-Administered Medications  Medication Dose Route Frequency Provider Last Rate Last Dose  . acetaminophen (TYLENOL) tablet 650 mg  650 mg Oral Q6H PRN Charm Rings, NP   650 mg at 04/03/15  2146  . albuterol (PROVENTIL HFA;VENTOLIN HFA) 108 (90 BASE) MCG/ACT inhaler 1-2 puff  1-2 puff Inhalation Q4H PRN Rachael FeeIrving A Lugo, MD   2 puff at 04/08/15 0741  . alum & mag hydroxide-simeth (MAALOX/MYLANTA) 200-200-20 MG/5ML suspension 30 mL  30 mL Oral Q4H PRN Charm RingsJamison Y Lord, NP      . BENZOCAINE (DENTAL) 20 % LIQD 1 application  1 application Mouth/Throat QID PRN Rachael FeeIrving A Lugo, MD   1 application at 04/07/15 [phone]  . buPROPion (WELLBUTRIN XL) 24 hr tablet 150 mg  150 mg Oral Daily Rachael FeeIrving A Lugo, MD   150 mg at 04/08/15 0746  . chlordiazePOXIDE (LIBRIUM) capsule 25 mg  25  mg Oral QID PRN Kerry HoughSpencer E Simon, PA-C   25 mg at 04/07/15 2110  . gabapentin (NEURONTIN) capsule 200 mg  200 mg Oral BID Thresa RossNadeem Valory Wetherby, MD   200 mg at 04/08/15 0745  . hydrOXYzine (ATARAX/VISTARIL) tablet 25 mg  25 mg Oral TID PRN Worthy FlankIjeoma E Nwaeze, NP   25 mg at 04/07/15 2110  . ibuprofen (ADVIL,MOTRIN) tablet 600 mg  600 mg Oral Q8H PRN Charm RingsJamison Y Lord, NP      . lamoTRIgine (LAMICTAL) tablet 50 mg  50 mg Oral BID Rachael FeeIrving A Lugo, MD   50 mg at 04/08/15 0746  . loratadine (CLARITIN) tablet 10 mg  10 mg Oral Daily Rachael FeeIrving A Lugo, MD   10 mg at 04/08/15 0745  . magnesium hydroxide (MILK OF MAGNESIA) suspension 30 mL  30 mL Oral Daily PRN Charm RingsJamison Y Lord, NP      . neomycin-bacitracin-polymyxin (NEOSPORIN) ointment   Topical PRN Shuvon B Rankin, NP      . nicotine (NICODERM CQ - dosed in mg/24 hours) patch 21 mg  21 mg Transdermal Daily Charm RingsJamison Y Lord, NP   21 mg at 04/08/15 0745  . ondansetron (ZOFRAN) tablet 4 mg  4 mg Oral Q8H PRN Charm RingsJamison Y Lord, NP      . pantoprazole (PROTONIX) EC tablet 40 mg  40 mg Oral Daily Charm RingsJamison Y Lord, NP   40 mg at 04/08/15 0746  . traZODone (DESYREL) tablet 100 mg  100 mg Oral QHS PRN Worthy FlankIjeoma E Nwaeze, NP   100 mg at 04/07/15 2158    Lab Results:  Results for orders placed or performed during the hospital encounter of 04/02/15 (from the past 48 hour(s))  CBC with Differential/Platelet     Status: Abnormal   Collection Time: 04/07/15  7:16 PM  Result Value Ref Range   WBC 4.7 4.0 - 10.5 K/uL   RBC 4.68 4.22 - 5.81 MIL/uL   Hemoglobin 13.2 13.0 - 17.0 g/dL   HCT 11.940.6 14.739.0 - 82.952.0 %   MCV 86.8 78.0 - 100.0 fL   MCH 28.2 26.0 - 34.0 pg   MCHC 32.5 30.0 - 36.0 g/dL   RDW 56.214.0 13.011.5 - 86.515.5 %   Platelets 126 (L) 150 - 400 K/uL   Neutrophils Relative % 52 %   Neutro Abs 2.4 1.7 - 7.7 K/uL   Lymphocytes Relative 36 %   Lymphs Abs 1.7 0.7 - 4.0 K/uL   Monocytes Relative 8 %   Monocytes Absolute 0.4 0.1 - 1.0 K/uL   Eosinophils Relative 4 %   Eosinophils Absolute 0.2 0.0  - 0.7 K/uL   Basophils Relative 0 %   Basophils Absolute 0.0 0.0 - 0.1 K/uL    Comment: [facility]    Physical Findings: AIMS: Facial and  Oral Movements Muscles of Facial Expression: None, normal Lips and Perioral Area: None, normal Jaw: None, normal Tongue: None, normal,Extremity Movements Upper (arms, wrists, hands, fingers): None, normal Lower (legs, knees, ankles, toes): None, normal, Trunk Movements Neck, shoulders, hips: None, normal, Overall Severity Severity of abnormal movements (highest score from questions above): None, normal Incapacitation due to abnormal movements: None, normal Patient's awareness of abnormal movements (rate only patient's report): No Awareness, Dental Status Current problems with teeth and/or dentures?: No Does patient usually wear dentures?: No  CIWA:  CIWA-Ar Total: 3 COWS:     Musculoskeletal: Strength & Muscle Tone: within normal limits Gait & Station: normal Patient leans: normal  Psychiatric Specialty Exam: Review of Systems  Constitutional: Positive for malaise/fatigue. Negative for fever.  Cardiovascular: Negative for chest pain.  Neurological: Negative for tremors.  Psychiatric/Behavioral: Positive for depression and substance abuse. The patient is nervous/anxious.     Blood pressure 114/69, pulse 68, temperature 97.8 F (36.6 C), temperature source Oral, resp. rate 16, height 6' (1.829 m), weight 78.472 kg (173 lb), SpO2 100 %.Body mass index is 23.46 kg/(m^2).  General Appearance: Well Groomed  Patent attorney::  Good  Speech:  Clear and Coherent  Volume:  Normal  Mood:  Less dysphoric  Affect:  Constricted, does smile briefly at times   Thought Process:  Coherent and Goal Directed  Orientation:  Full (Time, Place, and Person)  Thought Content:  Denies hallucinations, no delusions expressed, does not appear internally preoccupied   Suicidal Thoughts:  No- at this time denies any self injurious  ideations or any suicidal ideations, contracts for safety on the unit   Homicidal Thoughts:  No  Memory:  Recent and remote grossly intact   Judgement:   improved  Insight:   Fair   Psychomotor Activity:  Decreased  Concentration:  Good  Recall:  Good  Fund of Knowledge:Good  Language: Good  Akathisia:  No  Handed:  Right  AIMS (if indicated):     Assets:  Desire for Improvement Housing Social Support Talents/Skills  ADL's:  Intact  Cognition: WNL  Sleep:  Number of Hours: 6   Treatment Plan Summary: Daily contact with patient to assess and evaluate symptoms and progress in treatment and Medication management Supportive approach/coping skills Alcohol abuse; continue to work a relapse prevention plan Bipolar :lamictal was increased yesterday, somewhat better  Add neurontin for anxiety and as mood stabilizer.  bid. Somewhat sedated but does not want to lower the dose.  Continue  Wellbutrin XL 150 mg QAM for depression Continue Trazodone 50 mgrs QHS PRN for management of insomnia  Continue Librium 25 mgrs Q 6 hours PRN for possible  alcohol withdrawal symptoms, if needed Encourage increased milieu participation to work on symptom reduction and coping skills Repeat CBC to follow up on low WBC  Io Dieujuste 04/08/2015, 11:22 AM

## 2015-04-08 NOTE — [facility] Group Notes (Signed)
[facility] Group Notes:  (Clinical Social Work)  04/08/2015  10:00-11:00AM  Summary of Progress/Problems:   The main focus of today's process group was to   1)  discuss the importance of adding supports  2)  define health supports versus unhealthy supports  3)  plan how to handle current unhealthy supports  4)  Identify healthy supports to add.  The patient expressed resistance to the idea of putting distance between himself and the friends he uses or drinks with, stating they are like his family and he does not want to do that.  He acknowledged that he feels he can still control his use, and that perhaps he is not as far into the process of deciding to stop using as others in group are.  Type of Therapy:  Process Group with Motivational Interviewing  Participation Level:  Active  Participation Quality:  Attentive and Sharing  Affect:  Blunted and Depressed  Cognitive:  Appropriate  Insight:  Engaged  Engagement in Therapy:  Engaged  Modes of Intervention:   Education, Teacher, English as a foreign languageupport and Processing, Activity  Ambrose MantleMareida Grossman-Orr, LCSW 04/08/2015

## 2015-04-08 NOTE — Progress Notes (Signed)
D: Patient seen watching football game on day room; interacting and socializing with peers. Denies pain, SI, AH/VH at this time. No new complaint. No behavioral issues noted. A: Patient encouraged to continue with the treatment plans and verbalize needs to staff. Due medications given as ordered. Every 15 minutes check for safety maintained. Will continue to monitor patient for safety and stability.  R: Patient remains safe

## 2015-04-08 NOTE — Progress Notes (Signed)
Patient did attend the evening speaker AA meeting.  

## 2015-04-09 MED ORDER — HYDROXYZINE HCL 25 MG PO TABS: 25.0000 mg | ORAL_TABLET | Freq: Three times a day (TID) | ORAL | Status: DC | PRN

## 2015-04-09 MED ORDER — ESOMEPRAZOLE MAGNESIUM 40 MG PO CPDR: 40.0000 mg | DELAYED_RELEASE_CAPSULE | Freq: Every day | ORAL | Status: DC

## 2015-04-09 MED ORDER — NICOTINE 21 MG/24HR TD PT24: 21.0000 mg | MEDICATED_PATCH | Freq: Every day | TRANSDERMAL | Status: DC

## 2015-04-09 MED ORDER — TRAZODONE HCL 100 MG PO TABS: 100.0000 mg | ORAL_TABLET | Freq: Every evening | ORAL | Status: DC | PRN

## 2015-04-09 MED ORDER — GABAPENTIN 100 MG PO CAPS
200.0000 mg | ORAL_CAPSULE | Freq: Two times a day (BID) | ORAL | Status: DC
Start: 2015-04-09 — End: 2015-11-28

## 2015-04-09 MED ORDER — BENZOCAINE 20 % MT LIQD: 1.0000 "application " | Freq: Four times a day (QID) | OROMUCOSAL | Status: DC | PRN

## 2015-04-09 MED ORDER — BUPROPION HCL ER (XL) 150 MG PO TB24: 150.0000 mg | ORAL_TABLET | Freq: Every day | ORAL | Status: DC

## 2015-04-09 MED ORDER — LAMOTRIGINE 25 MG PO TABS: 50.0000 mg | ORAL_TABLET | Freq: Two times a day (BID) | ORAL | Status: DC

## 2015-04-09 MED ORDER — LORATADINE 10 MG PO TABS: 10.0000 mg | ORAL_TABLET | Freq: Every day | ORAL | Status: DC

## 2015-04-09 MED ORDER — BACITRACIN-NEOMYCIN-POLYMYXIN OINTMENT TUBE: 1.0000 "application " | TOPICAL_OINTMENT | CUTANEOUS | Status: DC | PRN
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
## 2015-04-09 NOTE — Progress Notes (Signed)
NSG shift assessment. 7a-7p.   D: Pt's affect is blunted and anxious. He interacts with others in the Day Room, and is holding hands with one of the male patients. On his Self Inventory he rated his depression as 7/10, feelings of hopelessness are 5/10, and his anxiety is 10/10. He indicated that he has thoughts of hurting himself, and when questioned he said that it is chronic, and that he is not going to hurt himself. His goal today is to have his pain controlled. He wants to control his anger because he has thoughts of hurting his room-mate. He contracts for safety.    A: Observed pt interacting in group and in the milieu: Support and encouragement offered. Safety maintained with observations every 15 minutes.   R:   Contracts for safety and continues to follow the treatment plan, working on learning new coping skills.   D: Pt verbalized readiness for D/C. Denies SI/HI, is not delusional or psychotic. All belongings returned to pt who signed for same.  A: Escorted pt to lobby. R: Pt left in his own car.

## 2015-04-09 NOTE — Progress Notes (Signed)
  [facility] Discharge Plan :  Will you be returning to the same living situation after discharge:  Yes,  home with his mother At discharge, do you have transportation home?: Yes,  Car in parking lot Do you have the ability to pay for your medications: Yes,  [employer] [facility]  Release of information consent forms completed and submitted to medical records by CSW.  Patient to Follow up at: Follow-up Information    Follow up with [facility] On 04/13/2015.   Why:  Appt on this date at 11:15AM for medication management.    Contact information:   [address] [place], [place] [address] Phone: [phone] Fax: [phone]      Follow up with [facility] On 04/11/2015.   Why:  Appt on this date at 1:00PM for counseling.    Contact information:   [address] [place], [place] [address] Phone: [phone] Fax: [phone]      Patient denies SI/HI: Yes,  during group/seld report.     Safety Planning and Suicide Prevention discussed: Yes,  SPE completed with pt's mother. SPI pamphlet and mobile crisis information provided to pt.   Have you used any form of tobacco in the last 30 days? (Cigarettes, Smokeless Tobacco, Cigars, and/or Pipes): Yes  Has patient been referred to the Quitline?: Patient refused referral  Smart, [name] LCSWA  04/09/2015, 10:27 AM

## 2015-04-09 NOTE — Tx Team (Signed)
Interdisciplinary Treatment Plan Update (Adult)  Date:  04/09/2015  Time Reviewed:  10:28 AM   Progress in Treatment: Attending groups: Yes  Participating in groups:  Yes  Taking medication as prescribed:  Yes. Tolerating medication:  Yes. Family/Significant othe contact made:  SPE completed with Jamie Hood and his mother.  Patient understands diagnosis:  Yes.AEB seeking treatment for depression, passive SI, ETOh abuse, and for medication stabilization.  Discussing patient identified problems/goals with staff:  Yes. Medical problems stabilized or resolved:  Yes. Denies suicidal/homicidal ideation: Yes, during group/self report.  Issues/concerns per patient self-inventory:  Other  Discharge Plan or Barriers: Jamie Hood has appts scheduled for med management and counseling through the [facility]. Jamie Hood also given NA/AA info and Mental Health Association pamphlet. Jamie Hood plans to drive himself home today-car in parking lot.   Reason for Continuation of Hospitalization: none  Comments:  29 y/o male who c/o etoh abuse, anxiety, depression and SI. Patient states, "My drinking has gotten out of control." Patient states he has been drinking every day 6-7 beers a day and sometimes more. Patient states he was here at [facility] earlier this year. Patient states he has just recently gotten back on his psychotropic medications and has been compliant. Patient states he is recently broke up with his fiance and had to move back to the area. Patient states he recently got a DUI and has a court date this week. Patient states, "I am ready to stop drinking because it is ruining my life." Patient states he is currently on disability and he is bored and he has nothing to do during the day. Patient states all of his friends drinking alcohol and smoke weed. Patient states he is currently passive SI but verbally contracts for safety. Patient denies HI and states he has auditory hallucinations saying negative  things. Patient denies physical, sexual, and verbal abuse.  Estimated length of stay:  D/c today   Additional Comments:  Patient and CSW reviewed Jamie Hood's identified goals and treatment plan. Patient verbalized understanding and agreed to treatment plan. CSW reviewed [facility] The "Discharge Process and Patient Involvement" Form. Jamie Hood verbalized understanding of information provided and signed form.    Review of initial/current patient goals per problem list:  1. Goal(s): Patient will participate in aftercare plan  Met: Yes  Target date: at discharge  As evidenced by: Patient will participate within aftercare plan AEB aftercare provider and housing plan at discharge being identified.  10/25: CSW assessing for appropriate referrals.   10/28: Jamie Hood plans to return home with mom/followup in place at the [facility].   2. Goal (s): Patient will exhibit decreased depressive symptoms and suicidal ideations.  Met: Yes    Target date: at discharge  As evidenced by: Patient will utilize self rating of depression at 3 or below and demonstrate decreased signs of depression or be deemed stable for discharge by MD.  10/25: Jamie Hood rates depression as high and endorses passive Si/Able to contract for safety on the unit.   10/28: Jamie Hood rates depression as 7-8 and presents with depressed mood/flat affect. Denies SI/HI/AVH.   10/31: Jamie Hood rates depression as "low" this morning and presents with pleasant mood/calm affect.   3. Goal(s): Patient will demonstrate decreased signs of withdrawal due to substance abuse  Met:Yes   Target date:at discharge   As evidenced by: Patient will produce a CIWA/COWS score of 0, have stable vitals signs, and no symptoms of withdrawal.  10/25: Jamie Hood reports mild withdrawals with CIWA score of  2 and stable vitals.   10/28: Jamie Hood reports no signs of withdrawal CIWA score of 0 and stable vitals.   Attendees: Patient:   04/09/2015 10:28 AM   Family:   04/09/2015 10:28 AM    Physician:  Dr. Carlton Adam, MD 04/09/2015 10:28 AM   Nursing:   Neta Mends RN 04/09/2015 10:28 AM   Clinical Social Worker: Maxie Better, [address]  04/09/2015 10:28 AM   Clinical Social Worker: Erasmo Downer Drinkard LCSWA; Peri Maris LCSWA 04/09/2015 10:28 AM   Other:  Gerline Legacy Nurse Case Manager 04/09/2015 10:28 AM   Other:  Lucinda Dell; [facility]  04/09/2015 10:28 AM   Other:   04/09/2015 10:28 AM   Other:  04/09/2015 10:28 AM   Other:  04/09/2015 10:28 AM   Other:  04/09/2015 10:28 AM    04/09/2015 10:28 AM    04/09/2015 10:28 AM    04/09/2015 10:28 AM    04/09/2015 10:28 AM    Scribe for Treatment Team:   Maxie Better, [place]  04/09/2015 10:28 AM

## 2015-04-09 NOTE — Progress Notes (Signed)
Pt attended spiritual care group on grief and loss facilitated by chaplain Simone Rodenbeck and counseling intern Kathryn Beam. Group opened with brief discussion and psycho-social ed around grief and loss in relationships and in relation to self - identifying life patterns, circumstances, changes that cause losses. Established group norm of speaking from own life experience. Group goal of establishing open and affirming space for members to share loss and experience with grief, normalize grief experience and provide psycho social education and grief support.    
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
## 2015-11-27 ENCOUNTER — Encounter (HOSPITAL_COMMUNITY): Payer: Self-pay | Admitting: Emergency Medicine

## 2015-11-27 ENCOUNTER — Observation Stay (HOSPITAL_COMMUNITY)
Admission: EM | Admit: 2015-11-27 | Discharge: 2015-11-28 | Disposition: A | Discharge status: Home / Self Care | Payer: 59 | Attending: Family Medicine | Admitting: Family Medicine

## 2015-11-27 ENCOUNTER — Emergency Department (HOSPITAL_COMMUNITY): Payer: 59

## 2015-11-27 ENCOUNTER — Other Ambulatory Visit: Payer: Self-pay

## 2015-11-27 DIAGNOSIS — Z720 Tobacco use: Secondary | ICD-10-CM | POA: Diagnosis not present

## 2015-11-27 DIAGNOSIS — F17200 Nicotine dependence, unspecified, uncomplicated: Secondary | ICD-10-CM | POA: Diagnosis not present

## 2015-11-27 DIAGNOSIS — I2609 Other pulmonary embolism with acute cor pulmonale: Secondary | ICD-10-CM

## 2015-11-27 DIAGNOSIS — I2699 Other pulmonary embolism without acute cor pulmonale: Principal | ICD-10-CM | POA: Diagnosis present

## 2015-11-27 DIAGNOSIS — R0781 Pleurodynia: Secondary | ICD-10-CM | POA: Insufficient documentation

## 2015-11-27 DIAGNOSIS — F319 Bipolar disorder, unspecified: Secondary | ICD-10-CM | POA: Diagnosis not present

## 2015-11-27 LAB — I-STAT CHEM 8, ED
BUN: 14 mg/dL (ref 6–20)
Calcium, Ion: 1.16 mmol/L (ref 1.12–1.23)
Chloride: 106 mmol/L (ref 101–111)
Creatinine, Ser: 1.2 mg/dL (ref 0.61–1.24)
Glucose, Bld: 98 mg/dL (ref 65–99)
HEMATOCRIT: 40 % (ref 39.0–52.0)
Hemoglobin: 13.6 g/dL (ref 13.0–17.0)
POTASSIUM: 3.9 mmol/L (ref 3.5–5.1)
SODIUM: 142 mmol/L (ref 135–145)
TCO2: 24 mmol/L (ref 0–100)

## 2015-11-27 LAB — RAPID URINE DRUG SCREEN, HOSP PERFORMED
Amphetamines: NOT DETECTED
BENZODIAZEPINES: NOT DETECTED
Barbiturates: NOT DETECTED
COCAINE: NOT DETECTED
OPIATES: NOT DETECTED
TETRAHYDROCANNABINOL: NOT DETECTED

## 2015-11-27 LAB — APTT: aPTT: 146 seconds — ABNORMAL HIGH (ref 24–37)

## 2015-11-27 LAB — TROPONIN I: Troponin I: <0.03 ng/mL (ref <0.031)

## 2015-11-27 LAB — CBC
HEMATOCRIT: 41.6 % (ref 39.0–52.0)
HEMOGLOBIN: 13.3 g/dL (ref 13.0–17.0)
MCH: 27.4 pg (ref 26.0–34.0)
MCHC: 32 g/dL (ref 30.0–36.0)
MCV: 85.6 fL (ref 78.0–100.0)
PLATELETS: 140 10*3/uL — AB (ref 150–400)
RBC: 4.86 MIL/uL (ref 4.22–5.81)
RDW: 13.4 % (ref 11.5–15.5)
WBC: 5.6 10*3/uL (ref 4.0–10.5)

## 2015-11-27 LAB — GLUCOSE, CAPILLARY: GLUCOSE-CAPILLARY: 84 mg/dL (ref 65–99)

## 2015-11-27 LAB — PROTIME-INR
INR: 1.16 (ref 0.00–1.49)
PROTHROMBIN TIME: 14.9 s (ref 11.6–15.2)

## 2015-11-27 LAB — I-STAT TROPONIN, ED: Troponin i, poc: 0 ng/mL (ref 0.00–0.08)

## 2015-11-27 LAB — MRSA PCR SCREENING: MRSA by PCR: NEGATIVE

## 2015-11-27 MED ORDER — ACETAMINOPHEN 325 MG PO TABS: 650.0000 mg | ORAL_TABLET | Freq: Four times a day (QID) | ORAL | Status: DC | PRN

## 2015-11-27 MED ORDER — ZOLPIDEM TARTRATE 5 MG PO TABS
5.0000 mg | ORAL_TABLET | Freq: Once | ORAL | Status: AC
  Administered 2015-11-27: 5 mg via ORAL
  Filled 2015-11-27: qty 1

## 2015-11-27 MED ORDER — HEPARIN (PORCINE) IN NACL 100-0.45 UNIT/ML-% IJ SOLN
1350.0000 [IU]/h | INTRAMUSCULAR | Status: DC
  Administered 2015-11-27: 1350 [IU]/h via INTRAVENOUS
  Filled 2015-11-27: qty 250

## 2015-11-27 MED ORDER — OXYCODONE HCL 5 MG PO TABS
5.0000 mg | ORAL_TABLET | ORAL | Status: DC | PRN
  Administered 2015-11-27 – 2015-11-28 (×4): 5 mg via ORAL
  Filled 2015-11-27 (×4): qty 1

## 2015-11-27 MED ORDER — FENTANYL CITRATE (PF) 100 MCG/2ML IJ SOLN
50.0000 ug | Freq: Once | INTRAMUSCULAR | Status: AC
  Administered 2015-11-27: 50 ug via INTRAVENOUS
  Filled 2015-11-27: qty 2

## 2015-11-27 MED ORDER — IOPAMIDOL (ISOVUE-370) INJECTION 76%
INTRAVENOUS | Status: AC
  Administered 2015-11-27: 100 mL
  Filled 2015-11-27: qty 100

## 2015-11-27 MED ORDER — IBUPROFEN 600 MG PO TABS: 600.0000 mg | ORAL_TABLET | Freq: Four times a day (QID) | ORAL | Status: DC | PRN

## 2015-11-27 MED ORDER — RIVAROXABAN 15 MG PO TABS
15.0000 mg | ORAL_TABLET | Freq: Two times a day (BID) | ORAL | Status: DC
  Administered 2015-11-27 – 2015-11-28 (×3): 15 mg via ORAL
  Filled 2015-11-27 (×3): qty 1

## 2015-11-27 MED ORDER — HEPARIN BOLUS VIA INFUSION
4000.0000 [IU] | Freq: Once | INTRAVENOUS | Status: AC
  Administered 2015-11-27: 4000 [IU] via INTRAVENOUS
  Filled 2015-11-27: qty 4000

## 2015-11-27 MED ORDER — RIVAROXABAN 15 MG PO TABS
15.0000 mg | ORAL_TABLET | Freq: Two times a day (BID) | ORAL | Status: DC
  Filled 2015-11-27: qty 1

## 2015-11-27 MED ORDER — NICOTINE 21 MG/24HR TD PT24
21.0000 mg | MEDICATED_PATCH | Freq: Every day | TRANSDERMAL | Status: DC
  Administered 2015-11-27 – 2015-11-28 (×2): 21 mg via TRANSDERMAL
  Filled 2015-11-27 (×2): qty 1

## 2015-11-27 NOTE — Care Management Note (Signed)
Case Management Note  Patient Details  Name: Elmyra RicksBradley Wesenberg MRN: [id number] Date of Birth: 04/14/1986  Subjective/Objective:                  30 yo [facility] with c/o shortness of breath and chest tightness/pain. /From home with family.  Action/Plan: Follow for disposition needs.   Expected Discharge Date:  11/28/15               Expected Discharge Plan:  Home/Self Care  In-House Referral:     Discharge planning Services  CM Consult  Post Acute Care Choice:    Choice offered to:     DME Arranged:    DME Agency:     HH Arranged:    HH Agency:     Status of Service:  In process, will continue to follow  Medicare Important Message Given:    Date Medicare IM Given:    Medicare IM give by:    Date Additional Medicare IM Given:    Additional Medicare Important Message give by:     If discussed at Long Length of Stay Meetings, dates discussed:    Additional Comments:  Oletta CohnWood, Jonavin Seder, RN 11/27/2015, 10:06 AM

## 2015-11-27 NOTE — ED Provider Notes (Signed)
Evaluated by critical care in the emergency department. They do not believe that patient is candidate for thrombolytics. Advised medicine admit. Discussed with family medicine resident. Will admit to telemetry bed. Vital signs remained stable in the emergency department.  Loren Raceravid Jethro Radke, MD 11/27/15 [phone]

## 2015-11-27 NOTE — Consult Note (Signed)
PULMONARY / CRITICAL CARE MEDICINE   Name: Jamie RicksBradley Bartolomei MRN: [id number] DOB: 10/17/1985    ADMISSION DATE:  11/27/2015 CONSULTATION DATE:  11/27/15  REFERRING MD:  Dr. Rhunette CroftNanavati  CHIEF COMPLAINT:  Shortness of breath and chest pain  HISTORY OF PRESENT ILLNESS:   This is a 30 yo male with past medical history of current tobacco use (1 ppd and 2-3 cigars per day on and off for 10 yrs), pulmonary embolism (2015), bipolar, and anxiety.  [facility]/27/2015 with c/o shortness of breath and chest tightness/pain.  Shortness of breath and pain worse with inspiration.  He has not done exertional activity since symptoms began. He states he had a pulmonary embolism in 2015, and was instructed to take xarelto as outpatient for several months, however never followed up.   He denies maternal family hx of blood clotting disorders, however unsure about paternal hx. Denies exogenous hormone use, long distance travel, recent surgeries, recent immobilization, or active cancer.  He currently works a Health and safety inspectordesk job, however he states he ambulates every 2 hours during work.  PCCM consulted 6/20 code PE initiated secondary to CT scan results indicating pulmonary embolus in segmental and subsegmental branches to the left lower lobe with right heart strain.  PAST MEDICAL HISTORY :  He  has a past medical history of Asthma and PE (pulmonary embolism).  Unprovoked PE in 06/2013. Was on xarelto x 6 mos. Was supposed to see heme onc but lost to f/u. [place] in [place] then.  Asthma has been stable.   PAST SURGICAL HISTORY: He  has past surgical history that includes Hernia repair and Incision and drainage wound with foreign body removal.  Allergies  Allergen Reactions  . Shellfish Allergy Anaphylaxis    No current facility-administered medications on file prior to encounter.   Current Outpatient Prescriptions on File Prior to Encounter  Medication Sig  . albuterol (PROVENTIL HFA;VENTOLIN HFA) 108 (90 BASE)  MCG/ACT inhaler Inhale 1-2 puffs into the lungs every 6 (six) hours as needed for wheezing or shortness of breath. (Patient not taking: Reported on 11/27/2015)  . BENZOCAINE, DENTAL, 20 % LIQD Use as directed 1 application in the mouth or throat 4 (four) times daily as needed (mouth pain). (Patient not taking: Reported on 11/27/2015)  . buPROPion (WELLBUTRIN XL) 150 MG 24 hr tablet Take 1 tablet (150 mg total) by mouth daily. (Patient not taking: Reported on 11/27/2015)  . esomeprazole (NEXIUM) 40 MG capsule Take 1 capsule (40 mg total) by mouth daily. (Patient not taking: Reported on 11/27/2015)  . gabapentin (NEURONTIN) 100 MG capsule Take 2 capsules (200 mg total) by mouth 2 (two) times daily. (Patient not taking: Reported on 11/27/2015)  . hydrOXYzine (ATARAX/VISTARIL) 25 MG tablet Take 1 tablet (25 mg total) by mouth 3 (three) times daily as needed for anxiety. (Patient not taking: Reported on 11/27/2015)  . lamoTRIgine (LAMICTAL) 25 MG tablet Take 2 tablets (50 mg total) by mouth 2 (two) times daily. (Patient not taking: Reported on 11/27/2015)  . loratadine (CLARITIN) 10 MG tablet Take 1 tablet (10 mg total) by mouth daily. (Patient not taking: Reported on 11/27/2015)  . neomycin-bacitracin-polymyxin (NEOSPORIN) OINT Apply 1 application topically as needed for wound care. (Patient not taking: Reported on 11/27/2015)  . nicotine (NICODERM CQ - DOSED IN MG/24 HOURS) 21 mg/24hr patch Place 1 patch (21 mg total) onto the skin daily. (Patient not taking: Reported on 11/27/2015)  . traZODone (DESYREL) 100 MG tablet Take 1 tablet (100 mg  total) by mouth at bedtime as needed for sleep. (Patient not taking: Reported on 11/27/2015)    FAMILY HISTORY:  His indicated that his mother is alive. He indicated that his father is alive. He indicated that his sister is alive.   No history of PE or DVT in family.   SOCIAL HISTORY: He  reports that he has been smoking.  He does not have any smokeless tobacco history on  file. He reports that he drinks alcohol. He reports that he uses illicit drugs (Marijuana).  Single, 1 son. Does office job, lots of sitting.   REVIEW OF SYSTEMS:  Positives in bold Gen: Denies fever, chills, weight change, fatigue, night sweats HEENT: Denies blurred vision, double vision, hearing loss, tinnitus, sinus congestion, rhinorrhea, sore throat, neck stiffness, dysphagia PULM: shortness of breath, cough, sputum production, hemoptysis, wheezing CV: pleuritic chest pain, edema, orthopnea, paroxysmal nocturnal dyspnea, palpitations GI: abdominal pain, nausea, vomiting, diarrhea, hematochezia, melena, constipation, change in bowel habits GU: Denies dysuria, hematuria, polyuria, oliguria, urethral discharge Endocrine: Denies hot or cold intolerance, polyuria, polyphagia or appetite change Derm: Denies rash, dry skin, scaling or peeling skin change Heme: Denies easy bruising, bleeding, bleeding gums Neuro: Denies headache, numbness, weakness, slurred speech, loss of memory or consciousness   SUBJECTIVE:  No acute distress currently states he has left siding chest tightness primarily with inspiration.  Currently on RA with O2 sats 100%.  No acute distress.  VITAL SIGNS: BP 108/73 mmHg  Pulse 51  Temp(Src) 98.8 F (37.1 C)  Resp 15  Ht 6' (1.829 m)  Wt 178 lb (80.74 kg)  BMI 24.14 kg/m2  SpO2 100%  HEMODYNAMICS:    VENTILATOR SETTINGS:    INTAKE / OUTPUT:    PHYSICAL EXAMINATION: General:  pleasant male resting in bed Neuro:  alert and oriented, follows commands HEENT:  supple, no JVD Cardiovascular:  s1s2, rrr, no M/R/G Lungs:  diminished LUL and LLL, clear all other lobes, even non labored Abdomen:  +BS x4, soft, tender all quadrants Musculoskeletal:  (-) Homan's sign, moves all extremities, normal bulk and tone Skin:  intact  LABS:  BMET  Recent Labs Lab 11/27/15 0544  NA 142  K 3.9  CL 106  BUN 14  CREATININE 1.20  GLUCOSE 98    Electrolytes No  results for input(s): CALCIUM, MG, PHOS in the last 168 hours.  CBC  Recent Labs Lab 11/27/15 0442 11/27/15 0544  WBC 5.6  --   HGB 13.3 13.6  HCT 41.6 40.0  PLT 140*  --     Coag's No results for input(s): APTT, INR in the last 168 hours.  Sepsis Markers No results for input(s): LATICACIDVEN, PROCALCITON, O2SATVEN in the last 168 hours.  ABG No results for input(s): PHART, PCO2ART, PO2ART in the last 168 hours.  Liver Enzymes No results for input(s): AST, ALT, ALKPHOS, BILITOT, ALBUMIN in the last 168 hours.  Cardiac Enzymes  Recent Labs Lab 11/27/15 0442  TROPONINI <0.03    Glucose No results for input(s): GLUCAP in the last 168 hours.  Imaging Ct Angio Chest Pe W/cm &/or Wo Cm  11/27/2015  CLINICAL DATA:  Left-sided chest pain and shortness of breath today. History of pulmonary embolus. EXAM: CT ANGIOGRAPHY CHEST WITH CONTRAST TECHNIQUE: Multidetector CT imaging of the chest was performed using the standard protocol during bolus administration of intravenous contrast. Multiplanar CT image reconstructions and MIPs were obtained to evaluate the vascular anatomy. CONTRAST:  100 cc Isovue 370. COMPARISON:  PA and lateral chest  10/13/2014. FINDINGS: Pulmonary embolus is identified in segmental and subsegmental branches to the left lower lobe. The RV/LV is 0.9 compatible with right heart strain. No pleural or pericardial effusion. No axillary, hilar or mediastinal lymphadenopathy. The lungs demonstrate a small area of peripheral wedge-shaped opacity in the left lung base which could be due to infarct. The right lung is clear. Incidentally imaged upper abdomen shows increased attenuation in the renal pyramids bilaterally which appears more intense than increased attenuation from contrast in the aorta. Visualized upper abdomen is otherwise unremarkable. No focal bony abnormality is identified. Review of the MIP images confirms the above findings. IMPRESSION: Positive for acute PE  with CT evidence of right heart strain (RV/LV Ratio = 0.9) consistent with at least submassive (intermediate risk) PE. The presence of right heart strain has been associated with an increased risk of morbidity and mortality. Please activate Code PE by paging [phone]. Increased attenuation in the kidneys bilaterally could be due to early contrast excretion but attenuation is more intense than that seen in the aorta or raise the possibility of medullary nephrocalcinosis. Critical Value/emergent results were called by telephone at the time of interpretation on 11/27/2015 at 7:49 am to Dr. Loren Racer , who verbally acknowledged these results. Electronically Signed   By: Drusilla Kanner M.D.   On: 11/27/2015 07:50     STUDIES:  CT of chest 6/20>>Positive for acute PE with CT evidence of right heart strain (RV/LV Ratio = 0.9) consistent with at least submassive (intermediate risk) PE. The presence of right heart strain has been associated with an increased risk of morbidity and mortality. Increased attenuation in the kidneys bilaterally could be due to early contrast excretion but attenuation is more intense than that seen in the aorta or raise the possibility of medullary nephrocalcinosis. Urine drug 6/20>> [place] bilateral lower extremities 6/20>>  CULTURES: None  ANTIBIOTICS: None   SIGNIFICANT EVENTS: -Pt presented to [facility] 6/20 with c/o shortness of breath and chest tightness/chest pain diagnosed with PE with right heart strain.  LINES/TUBES: PIV x1  DISCUSSION: Pt presented to [facility] 6/20 with c/o shortness of breath and chest tightness/chest pain diagnosed with PE with right heart strain.  Code PE initiated PCCM consulted 6/20  ASSESSMENT / PLAN: Acute Respiratory Failure secondary to left lower lobe pulmonary embolism with right heart strain  PLAN May need to consult IR for ECOS due to right heart strain for thrombolytic therapy, however pt currently stable highly doubt  pt will require ECOS if pt decompensates will consult IR Supplemental O2 as needed to maintain O2 sats 92% or above or for dyspnea Heparin drip dosing per pharmacy [place] bilateral lower extremities to r/o DVT Urine drug screen pending Hematology workup Trend troponin's Trend CBC's and monitor for s/sx of bleeding Transfuse if HgB <7 Bedrest for now   FAMILY  - Updates: Pt and pts mother updated about plan of care and questions answered.  - Inter-disciplinary family meet or Palliative Care meeting due by:  12/04/2015  Sonda Rumble, AGNP  Pulmonary/Critical Care     ATTENDING NOTE / ATTESTATION NOTE :   I have discussed the case with the resident/APP Sonda Rumble.  I agree with the resident/APP's  history, physical examination, assessment, and plans.  I have edited the above note and modified it according to our agreed history, physical examination, assessment and plan.   Pt presents with L sided pleuritic cp and SOB over last 2-3 days similar to 2015.  CTA personally reviewed. With  segmental and subsegmental perfusion defects in PA to LLL.  With pulmonary infacrt in LLL.  No big central filling defects in main PA or R and L PA. VSS. Hemodynamically stable. 100% on RA. CTA. (-) edema.   Pt with Recurrent Pulmonary Embolus with pulmonary infarct in LLL. Unprovoked.  Consider Pulm HTN 2/2 above.  Possible Chronic PE.   Plan : 1. Suggest to hold off on EKOS. Pt is haemodynamically stable and no big clots seen.  Likely with acute on chronic PE.  2. On heparin drip.  Can transition to PO NOACs.  Suggest NOACS indefinitely >>  2nd PE, unprovoked.  3. Check echo, bnp. Troponin was (-). Check for pulm HTN.  4. Needs outpt f/u for chronic PE. Will need a rpt CTA in 6 mos or so or maybe a VQ scan. Will also need PFTs, sleep study as part of work up for pulm HTN. 5.  PCCM will sign off for now. Call back if with questions or issues.  I anticipate he can be discharged if no issues next 24-48  hrs.   Family :Family updated at length today.  Extensively d/w pt and mother re: plan.    Pollie Meyer, MD 11/27/2015, 10:28 AM [facility] Pager [phone] After 3 pm or if no answer, call [phone]

## 2015-11-27 NOTE — Progress Notes (Signed)
ANTICOAGULATION CONSULT NOTE - Initial Consult  Pharmacy Consult for Heparin Indication: pulmonary embolus  Allergies  Allergen Reactions  . Shellfish Allergy Anaphylaxis    Patient Measurements: Height: 6' (182.9 cm) Weight: 178 lb (80.74 kg) IBW/kg (Calculated) : 77.6 Heparin Dosing Weight: 80.7 kg  Vital Signs: Temp: 98.8 F (37.1 C) (06/20 0436) BP: 114/72 mmHg (06/20 0737) Pulse Rate: 56 (06/20 0737)  Labs:  Recent Labs  11/27/15 0442 11/27/15 0544  HGB 13.3 13.6  HCT 41.6 40.0  PLT 140*  --   CREATININE  --  1.20  TROPONINI <0.03  --     Estimated Creatinine Clearance: 99.7 mL/min (by C-G formula based on Cr of 1.2).   Medical History: Past Medical History  Diagnosis Date  . Asthma   . PE (pulmonary embolism)     Assessment: 30 yo M presents on 6/20 with SOB. CT shows acute PE with right heart strain. Patient has history of PE in 2015. No anticoag PTA and patient reports not taking any of his medications. Pharmacy consulted to dose heparin. Hgb and plts stable.  Goal of Therapy:  Heparin level 0.3-0.7 units/ml Monitor platelets by anticoagulation protocol: Yes   Plan:  Give 4,000 unit heparin bolus Start heparin gtt at 1,350 units/hr Check 6 hr HL Monitor daily HL, CBC, s/s of bleed  Enzo BiNathan Angeles Paolucci, PharmD, [facility] Clinical Pharmacist Pager [phone] 11/27/2015 8:09 AM

## 2015-11-27 NOTE — Progress Notes (Signed)
eLink Physician-Brief Progress Note Patient Name: Elmyra RicksBradley Eastlick DOB: 06/01/1986 MRN: [id number]   Date of Service  11/27/2015  HPI/Events of Note  Patient request sleeping aid.  eICU Interventions  Will order Ambien 5 mg PO X 1.     Intervention Category Intermediate Interventions: Other:  Lenell AntuSommer,Tamerra Merkley Eugene 11/27/2015, 9:29 PM

## 2015-11-27 NOTE — ED Notes (Signed)
Pt in from home with c/o cp and dyspnea that began tonight before bed. Pt says pain is L sided, tight and sharp with breaths, goes down to L lower rib cage. Pt has hx of PE in 15'. Denies an recent long trips/travel, pt works a Office managerdesk job, is active throughout day, exercises frequently.

## 2015-11-27 NOTE — ED Provider Notes (Signed)
Discuss CT results with CCM. Will see patient in the emergency department. Heparin started per pharmacy. The patient's vital signs remain stable.    Loren Raceravid Kayson Bullis, MD 11/27/15 [phone]

## 2015-11-27 NOTE — ED Provider Notes (Signed)
CSN: [id number]     Arrival date & time 11/27/15  0417 History   First MD Initiated Contact with Patient 11/27/15 [phone]     Chief Complaint  Patient presents with  . Chest Pain  . Shortness of Breath     (Consider location/radiation/quality/duration/timing/severity/associated sxs/prior Treatment) HPI Comments: Pt comes in with cc of chest pain. Pt has hx of PE in 2015. He reports that he started having some chest discomfort on the L side few days back that has worsened recently, and was significant enough tonight where he was unable to sleep. Pain is described as tightness, and it is worse with deep breath. PT has exertional dyspnea. No new cough. Pt denies any exogenous hormone use, long distance travels or surgery in the past 6 weeks, active cancer, recent immobilization. He took his xarelto in 2015 as prescribed, but never followed up. + smoker, denies drug use.   ROS 10 Systems reviewed and are negative for acute change except as noted in the HPI.      The history is provided by the patient.    Past Medical History  Diagnosis Date  . Asthma   . PE (pulmonary embolism)    Past Surgical History  Procedure Laterality Date  . Hernia repair    . Incision and drainage wound with foreign body removal     Family History  Problem Relation Age of Onset  . Schizophrenia Father    Social History  Substance Use Topics  . Smoking status: Current Every Day Smoker -- 1.00 packs/day  . Smokeless tobacco: None  . Alcohol Use: Yes     Comment: daily    Review of Systems    Allergies  Shellfish allergy  Home Medications   Prior to Admission medications   Medication Sig Start Date End Date Taking? Authorizing Provider  albuterol (PROVENTIL HFA;VENTOLIN HFA) 108 (90 BASE) MCG/ACT inhaler Inhale 1-2 puffs into the lungs every 6 (six) hours as needed for wheezing or shortness of breath. Patient not taking: Reported on 11/27/2015 10/21/14   Adonis BrookSheila Agustin, NP  BENZOCAINE, DENTAL,  20 % LIQD Use as directed 1 application in the mouth or throat 4 (four) times daily as needed (mouth pain). Patient not taking: Reported on 11/27/2015 04/09/15   Beau FannyJohn C Withrow, FNP  buPROPion (WELLBUTRIN XL) 150 MG 24 hr tablet Take 1 tablet (150 mg total) by mouth daily. Patient not taking: Reported on 11/27/2015 04/09/15   Beau FannyJohn C Withrow, FNP  esomeprazole (NEXIUM) 40 MG capsule Take 1 capsule (40 mg total) by mouth daily. Patient not taking: Reported on 11/27/2015 04/09/15   Beau FannyJohn C Withrow, FNP  gabapentin (NEURONTIN) 100 MG capsule Take 2 capsules (200 mg total) by mouth 2 (two) times daily. Patient not taking: Reported on 11/27/2015 04/09/15   Beau FannyJohn C Withrow, FNP  hydrOXYzine (ATARAX/VISTARIL) 25 MG tablet Take 1 tablet (25 mg total) by mouth 3 (three) times daily as needed for anxiety. Patient not taking: Reported on 11/27/2015 04/09/15   Beau FannyJohn C Withrow, FNP  ibuprofen (ADVIL,MOTRIN) 600 MG tablet Take 1 tablet (600 mg total) by mouth every 6 (six) hours as needed. 11/27/15   Derwood KaplanAnkit Hrithik Boschee, MD  lamoTRIgine (LAMICTAL) 25 MG tablet Take 2 tablets (50 mg total) by mouth 2 (two) times daily. Patient not taking: Reported on 11/27/2015 04/09/15   Beau FannyJohn C Withrow, FNP  loratadine (CLARITIN) 10 MG tablet Take 1 tablet (10 mg total) by mouth daily. Patient not taking: Reported on 11/27/2015 04/09/15   Everardo AllJohn C  Withrow, FNP  neomycin-bacitracin-polymyxin (NEOSPORIN) OINT Apply 1 application topically as needed for wound care. Patient not taking: Reported on 11/27/2015 04/09/15   Beau Fanny, FNP  nicotine (NICODERM CQ - DOSED IN MG/24 HOURS) 21 mg/24hr patch Place 1 patch (21 mg total) onto the skin daily. Patient not taking: Reported on 11/27/2015 04/09/15   Beau Fanny, FNP  traZODone (DESYREL) 100 MG tablet Take 1 tablet (100 mg total) by mouth at bedtime as needed for sleep. Patient not taking: Reported on 11/27/2015 04/09/15   Beau Fanny, FNP   BP 108/73 mmHg  Pulse 51  Temp(Src) 98.8 F (37.1  C)  Resp 15  Ht 6' (1.829 m)  Wt 178 lb (80.74 kg)  BMI 24.14 kg/m2  SpO2 100% Physical Exam  Constitutional: He is oriented to person, place, and time. He appears well-developed.  HENT:  Head: Normocephalic and atraumatic.  Eyes: Conjunctivae and EOM are normal. Pupils are equal, round, and reactive to light.  Neck: Normal range of motion. Neck supple.  Cardiovascular: Normal rate, regular rhythm and normal heart sounds.   Pulmonary/Chest: Effort normal and breath sounds normal. No respiratory distress. He has no wheezes.  Abdominal: Soft. Bowel sounds are normal. He exhibits no distension. There is no tenderness. There is no rebound and no guarding.  Neurological: He is alert and oriented to person, place, and time.  Skin: Skin is warm.  Nursing note and vitals reviewed.   ED Course  .Critical Care Performed by: Derwood Kaplan Authorized by: Derwood Kaplan Total critical care time: 38 minutes Critical care time was exclusive of separately billable procedures and treating other patients. Critical care was necessary to treat or prevent imminent or life-threatening deterioration of the following conditions: pulmonary embolism with right sided strain. Critical care was time spent personally by me on the following activities: development of treatment plan with patient or surrogate, discussions with consultants, evaluation of patient's response to treatment, pulse oximetry, examination of patient, obtaining history from patient or surrogate, ordering and performing treatments and interventions, ordering and review of laboratory studies, ordering and review of radiographic studies and re-evaluation of patient's condition.   (including critical care time) Labs Review Labs Reviewed  CBC - Abnormal; Notable for the following:    Platelets 140 (*)    All other components within normal limits  TROPONIN I  HEPARIN LEVEL (UNFRACTIONATED)  I-STAT TROPOININ, ED  I-STAT CHEM 8, ED     Imaging Review Ct Angio Chest Pe W/cm &/or Wo Cm  11/27/2015  CLINICAL DATA:  Left-sided chest pain and shortness of breath today. History of pulmonary embolus. EXAM: CT ANGIOGRAPHY CHEST WITH CONTRAST TECHNIQUE: Multidetector CT imaging of the chest was performed using the standard protocol during bolus administration of intravenous contrast. Multiplanar CT image reconstructions and MIPs were obtained to evaluate the vascular anatomy. CONTRAST:  100 cc Isovue 370. COMPARISON:  PA and lateral chest 10/13/2014. FINDINGS: Pulmonary embolus is identified in segmental and subsegmental branches to the left lower lobe. The RV/LV is 0.9 compatible with right heart strain. No pleural or pericardial effusion. No axillary, hilar or mediastinal lymphadenopathy. The lungs demonstrate a small area of peripheral wedge-shaped opacity in the left lung base which could be due to infarct. The right lung is clear. Incidentally imaged upper abdomen shows increased attenuation in the renal pyramids bilaterally which appears more intense than increased attenuation from contrast in the aorta. Visualized upper abdomen is otherwise unremarkable. No focal bony abnormality is identified. Review of the MIP  images confirms the above findings. IMPRESSION: Positive for acute PE with CT evidence of right heart strain (RV/LV Ratio = 0.9) consistent with at least submassive (intermediate risk) PE. The presence of right heart strain has been associated with an increased risk of morbidity and mortality. Please activate Code PE by paging [phone]. Increased attenuation in the kidneys bilaterally could be due to early contrast excretion but attenuation is more intense than that seen in the aorta or raise the possibility of medullary nephrocalcinosis. Critical Value/emergent results were called by telephone at the time of interpretation on 11/27/2015 at 7:49 am to Dr. Loren Racer , who verbally acknowledged these results. Electronically  Signed   By: Drusilla Kanner M.D.   On: 11/27/2015 07:50   I have personally reviewed and evaluated these images and lab results as part of my medical decision-making.   EKG Interpretation   Date/Time:  Tuesday November 27 2015 04:24:35 EDT Ventricular Rate:  70 PR Interval:    QRS Duration: 83 QT Interval:  371 QTC Calculation: 401 R Axis:   70 Text Interpretation:  Sinus rhythm No acute changes Confirmed by Rhunette Croft,  MD, Ajayla Iglesias ([id number]) on 11/27/2015 7:03:44 AM      MDM   Final diagnoses:  Pleuritic chest pain  Other acute pulmonary embolism with acute cor pulmonale (HCC)    PT comes in with cc of chest pain. He has hx of PE and he reports that the pain is similar. His vitals are stable and wnl - but the clinical suspicion for PE is still high, and so CT PE ordered, which confirmed PE. PT has Rt sided strain. Reassessed, and continues to be stable. Dr. Ranae Palms taking over care. PT aware of the diagnoses.    Derwood Kaplan, MD 11/27/15 [phone]

## 2015-11-27 NOTE — ED Notes (Signed)
MD at bedside. 
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
## 2015-11-28 ENCOUNTER — Inpatient Hospital Stay (HOSPITAL_BASED_OUTPATIENT_CLINIC_OR_DEPARTMENT_OTHER): Payer: 59

## 2015-11-28 DIAGNOSIS — F319 Bipolar disorder, unspecified: Secondary | ICD-10-CM | POA: Diagnosis not present

## 2015-11-28 DIAGNOSIS — I2609 Other pulmonary embolism with acute cor pulmonale: Secondary | ICD-10-CM | POA: Diagnosis not present

## 2015-11-28 DIAGNOSIS — F17200 Nicotine dependence, unspecified, uncomplicated: Secondary | ICD-10-CM | POA: Diagnosis not present

## 2015-11-28 DIAGNOSIS — I2699 Other pulmonary embolism without acute cor pulmonale: Secondary | ICD-10-CM | POA: Diagnosis not present

## 2015-11-28 DIAGNOSIS — Z720 Tobacco use: Secondary | ICD-10-CM | POA: Diagnosis not present

## 2015-11-28 DIAGNOSIS — R0781 Pleurodynia: Secondary | ICD-10-CM | POA: Diagnosis not present

## 2015-11-28 LAB — ECHOCARDIOGRAM COMPLETE
CHL CUP MV DEC (S): 208
CHL CUP TV REG PEAK VELOCITY: 204 cm/s
EERAT: 5.61
EWDT: 208 ms
FS: 34 % (ref 28–44)
Height: 72 in
IV/PV OW: 0.37
LA vol index: 16.7 mL/m2
LA vol: 33.8 mL
LAVOLA4C: 30.8 mL
LV E/e' medial: 5.61
LV E/e'average: 5.61
LV PW d: 25.8 mm — AB (ref 0.6–1.1)
LV TDI E'LATERAL: 14
LV TDI E'MEDIAL: 12.4
LV e' LATERAL: 14 cm/s
LVOT area: 3.8 cm2
LVOTD: 22 mm
MV pk E vel: 78.6 m/s
MVPG: 2 mmHg
MVPKAVEL: 52.4 m/s
TAPSE: 28.2 mm
TR max vel: 204 cm/s
Weight: 2848 oz

## 2015-11-28 LAB — BASIC METABOLIC PANEL
Anion gap: 4 — ABNORMAL LOW (ref 5–15)
BUN: 13 mg/dL (ref 6–20)
CO2: 26 mmol/L (ref 22–32)
CREATININE: 1.31 mg/dL — AB (ref 0.61–1.24)
Calcium: 8.9 mg/dL (ref 8.9–10.3)
Chloride: 107 mmol/L (ref 101–111)
GFR calc Af Amer: >60 mL/min (ref >60)
GFR calc non Af Amer: >60 mL/min (ref >60)
GLUCOSE: 117 mg/dL — AB (ref 65–99)
POTASSIUM: 4 mmol/L (ref 3.5–5.1)
Sodium: 137 mmol/L (ref 135–145)

## 2015-11-28 LAB — CBC
HCT: 39.5 % (ref 39.0–52.0)
HEMOGLOBIN: 12.5 g/dL — AB (ref 13.0–17.0)
MCH: 26.7 pg (ref 26.0–34.0)
MCHC: 31.6 g/dL (ref 30.0–36.0)
MCV: 84.4 fL (ref 78.0–100.0)
Platelets: 139 10*3/uL — ABNORMAL LOW (ref 150–400)
RBC: 4.68 MIL/uL (ref 4.22–5.81)
RDW: 13.3 % (ref 11.5–15.5)
WBC: 5.2 10*3/uL (ref 4.0–10.5)

## 2015-11-28 LAB — BRAIN NATRIURETIC PEPTIDE: B NATRIURETIC PEPTIDE 5: 22 pg/mL (ref 0.0–100.0)

## 2015-11-28 MED ORDER — POLYETHYLENE GLYCOL 3350 17 G PO PACK: 17.0000 g | PACK | Freq: Every day | ORAL | Status: DC | PRN

## 2015-11-28 MED ORDER — ACETAMINOPHEN 325 MG PO TABS
650.0000 mg | ORAL_TABLET | Freq: Four times a day (QID) | ORAL | Status: DC
  Administered 2015-11-28: 650 mg via ORAL
  Filled 2015-11-28: qty 2

## 2015-11-28 MED ORDER — IBUPROFEN 600 MG PO TABS: 600.0000 mg | ORAL_TABLET | Freq: Four times a day (QID) | ORAL | Status: DC | PRN

## 2015-11-28 MED ORDER — NICOTINE 21 MG/24HR TD PT24: 21.0000 mg | MEDICATED_PATCH | Freq: Every day | TRANSDERMAL | Status: DC

## 2015-11-28 MED ORDER — BUPROPION HCL ER (XL) 150 MG PO TB24: 150.0000 mg | ORAL_TABLET | Freq: Every day | ORAL | Status: DC

## 2015-11-28 MED ORDER — RIVAROXABAN (XARELTO) VTE STARTER PACK (15 & 20 MG): ORAL_TABLET | ORAL | Status: DC

## 2015-11-28 MED ORDER — HYDROCORTISONE 1 % EX CREA
TOPICAL_CREAM | Freq: Two times a day (BID) | CUTANEOUS | Status: DC
  Administered 2015-11-28: 13:00:00 via TOPICAL
  Filled 2015-11-28: qty 28

## 2015-11-28 MED ORDER — TRAMADOL HCL 50 MG PO TABS
50.0000 mg | ORAL_TABLET | Freq: Four times a day (QID) | ORAL | Status: DC | PRN
  Administered 2015-11-28: 50 mg via ORAL
  Filled 2015-11-28: qty 1

## 2015-11-28 MED ORDER — ACETAMINOPHEN 325 MG PO TABS: 650.0000 mg | ORAL_TABLET | Freq: Four times a day (QID) | ORAL | Status: AC | PRN

## 2015-11-28 MED ORDER — OXYCODONE HCL 5 MG PO TABS: 5.0000 mg | ORAL_TABLET | ORAL | Status: DC | PRN

## 2015-11-28 MED ORDER — HYDROCORTISONE 1 % EX CREA: TOPICAL_CREAM | Freq: Two times a day (BID) | CUTANEOUS | Status: DC

## 2015-11-28 MED ORDER — TRAMADOL HCL 50 MG PO TABS: 50.0000 mg | ORAL_TABLET | Freq: Four times a day (QID) | ORAL | Status: DC | PRN

## 2015-11-28 MED ORDER — OXYCODONE HCL 5 MG PO TABS: 5.0000 mg | ORAL_TABLET | Freq: Four times a day (QID) | ORAL | Status: DC | PRN

## 2015-11-28 NOTE — Progress Notes (Signed)
  Echocardiogram 2D Echocardiogram has been performed.  Leta JunglingCooper, Reilyn Nelson M 11/28/2015, 12:02 PM

## 2015-11-28 NOTE — Consult Note (Signed)
   [facility]   11/28/2015  Jamie RicksBradley Sahlin 05/14/1986 [id number]   Received telephone call from inpatient RNCM for [facility]. This patient is not eligible for [facility].  Patient is not a beneficiary currently attributed to one of the [facility] populations. Made inpatient RNCM aware.   Raiford NobleAtika Hall, MSN-Ed, RN,BSN [facility] Care [facility] Hospital Liaison [phone]

## 2015-11-28 NOTE — Care Management Note (Signed)
Case Management Note  Patient Details  Name: Jamie Hood MRN: [id number] Date of Birth: 07/09/1985  Subjective/Objective:    Per [employer] rep, they will provide coverage for pt to [facility].  Insurance plan information listed in EPIC is incorrect - will ask family to provide current insurance information to admitting office.  CM will provide pt with Xarelto 30-day free card plus 0 copay card.                             Expected Discharge Plan:  Home/Self Care  Discharge planning Services  CM Consult, Medication Assistance  Status of Service:  In process, will continue to follow  Magdalene RiverMayo, Malichi Palardy T, RN 11/28/2015, 10:12 AM

## 2015-11-28 NOTE — Progress Notes (Signed)
  Went over all d/c instructions with pt. Case manager gave pt Xarelto cards for free 30 day and for 0 $ co-pay. Pt highly encouraged to take his meds since he has prior DVT and now has PE's. Family here seem to be very  attentive to pt's needs.

## 2015-11-28 NOTE — Progress Notes (Signed)
Pt discharged - refused wheelchair ambulated and accompanied by 4 adult relatives with all belongings. He has D/C instructions and Exit care notes on Xarelto and bleeding precautions. Home per private vehicle Pt says now he cannot make appointment that we made for him - told to call immediately when he gets home and cancel that one but make a new one to be seen. Has all the info on obtaining Xarelto at reduced co pay and first 30 days free.

## 2015-11-28 NOTE — Discharge Summary (Signed)
Family Medicine [facility]charge Summary  Patient name: Jamie RicksBradley Mapp Medical record number: [id number] Date of birth: 06/17/1985 Age: 30 y.o. Gender: male Date of Admission: 11/27/2015  Date of Discharge: 11/28/2015 Admitting Physician: Carney LivingMarshall L Chambliss, MD  Primary Care Provider: Jamelle HaringHillary M Fitzgerald, MD Consultants: PCCM  Indication for Hospitalization: Pulmonary Embolism with evidence of Right Heart Strain on CTA chest  Discharge Diagnoses/Problem List:  Active Problems:   Pulmonary embolism (HCC)   Tobacco abuse   Bipolar disorder  Disposition: Home  Discharge Condition: Improved  Discharge Exam:  Blood pressure 105/65, pulse 71, temperature 98.9 F (37.2 C), temperature source Oral, resp. rate 16, height 6' (1.829 m), weight 178 lb (80.74 kg), SpO2 99 %. Physical Exam: General: Well-appearing male, resting in bed Cardiovascular: RRR, S1, S2, no m/r/g, no JVD Respiratory: Slightly diminished breath sounds over left mid and lower lung fields, but improved from yesterday. Clear over right lung fields. No increased work of breathing.  Abdomen: +BS, S, mild TTP of lower abdomen, no rebound or guarding MSK: 5/5 strength in upper and lower extremities. No LE swelling, erythema, or edema. Negative Homan's sign bilaterally. No chest wall TTP.  Skin: No rashes, lesions, or areas of erythema. Tattoos present.  Neuro: AOx3. No focal deficits.  Psych: Appropriate Mood and Affect. Did not endorse SI.  Brief Hospital Course:  Jamie RicksBradley Po is a 30 y.o. male who presented with chest pain and shortness of breath. PMH is significant for PE in January 2015 (treated with 6 months xarelto), bipolar disorder, asthma (no longer on treatment), and gastritis.   Submassive L PE: Given presenting symptoms of CP and SOB, patient was evaluated for PE in the ED, and CTA chest showed PE in segmental and subsegmental branches to left lower lobe and evidence of right heart strain  with RV/LV of 0.9. 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 He was started on a heparin drip. PCCM was consulted for evaluation for possible thrombolytics and recommended quick transition to NOAC; acute on chronic PE suspected due to patient's stable vital signs and no big clots seen.   PCCM recommended admission for completion of work-up with ECHO and provided follow-up recommendations to repeat CTA vs VQ scan in about 6 months and obtain PFTs and sleep study as part of work up for pulmonary HTN. Patient was observed overnight and continued to maintain oxygen saturation at rest and with walking on room air and had stable vital signs throughout hospitalization. ECHO showed LV EF 55-60%, no patent foramen ovale, and systolic pressure of pulmonary artery was WNL. Preliminary result of LE venous duplex negative for DVT or baker's cyst. Patient was discharged on xarelto. Case Manager assisted with discharge planning and provided Xarelto 30-day free card plus 0 copay card.  Of note, this is patient's second PE. His mother had a DVT in her teens, but otherwise family history is negative. Patient should continue on anticoagulation indefinitely. No further coagulopathy work-up recommended, as it would not change treatment. Patient has not had weight loss, and CT was without any nodules to suggest malignancy work-up should be initiated.   Chest pain: Troponins were negative x 2. He had 2 normal EKGs. Pain believed to be secondary to PE. Patient was discharged with  tramadol (#20 pills) and tylenol for chest pain.  Tobacco Abuse:  10-year pack history. Counseled patient on increased risk of blood clots and cardiovascular disease with smoking. Discharged patient  with nicotine patch and instructions on how to taper down if this strategy proves successful.   Bipolar disorder:  Mood and affect were appropriate on admission, though he says mood is low with  news of repeat PE. He is established with psychologist and psychiatrist but has not been on medication for the last few months, as he was feeling well. He requested starting something for mood, so his 1 month of his home wellbutrin XL 150 mg daily was prescribed. Patient was advised to follow-up with his psychiatrist.   Issues for Follow Up:  1. Ask about any continued symptoms of chest pain or shortness of breath. 2. Inquire about smoking cessation. If he chose to use the 21 mg patch after discharge, consider prescribing 14 mg patch to continue taper.  3. Patient will need prescription for xarelto 20 mg after he has completed 1 month starter pack.  4. Would not pursue coagulopathy work-up as would not change management. 5. Repeat CTA vs VQ scan in about 6 months and obtain PFTs and sleep study as part of work up for pulmonary HTN  Significant Procedures: LE Dopplers and ECHO performed 11/28/15  Significant Labs and Imaging:   Recent Labs Lab 11/27/15 0442 11/27/15 0544 11/28/15 0340  WBC 5.6  --  5.2  HGB 13.3 13.6 12.5*  HCT 41.6 40.0 39.5  PLT 140*  --  139*    Recent Labs Lab 11/27/15 0544 11/28/15 0340  NA 142 137  K 3.9 4.0  CL 106 107  CO2  --  26  GLUCOSE 98 117*  BUN 14 13  CREATININE 1.20 1.31*  CALCIUM  --  8.9   Ct Angio Chest Pe W/cm &/or Wo Cm  11/27/2015  IMPRESSION: Positive for acute PE with CT evidence of right heart strain (RV/LV Ratio = 0.9) consistent with at least submassive (intermediate risk) PE. Increased attenuation in the kidneys bilaterally could be due to early contrast excretion but attenuation is more intense than that seen in the aorta or raise the possibility of medullary nephrocalcinosis.   Results/Tests Pending at Time of Discharge: None   Discharge Medications:    Medication List    STOP taking these medications        albuterol 108 (90 Base) MCG/ACT inhaler  Commonly known as:  PROVENTIL HFA;VENTOLIN HFA     BENZOCAINE (DENTAL) 20  % Liqd     esomeprazole 40 MG capsule  Commonly known as:  NEXIUM     gabapentin 100 MG capsule  Commonly known as:  NEURONTIN     hydrOXYzine 25 MG tablet  Commonly known as:  ATARAX/VISTARIL     lamoTRIgine 25 MG tablet  Commonly known as:  LAMICTAL     neomycin-bacitracin-polymyxin Oint  Commonly known as:  NEOSPORIN     traZODone 100 MG tablet  Commonly known as:  DESYREL      TAKE these medications        acetaminophen 325 MG tablet  Commonly known as:  TYLENOL  Take 2 tablets (650 mg total) by mouth every 6 (six) hours as needed for mild pain or moderate pain.     buPROPion 150 MG 24 hr tablet  Commonly known as:  WELLBUTRIN XL  Take 1 tablet (150 mg total) by mouth daily.     hydrocortisone cream 1 %  Apply topically 2 (two) times daily.     loratadine 10 MG tablet  Commonly known as:  CLARITIN  Take 1 tablet (10 mg total) by mouth  daily.     nicotine 21 mg/24hr patch  Commonly known as:  NICODERM CQ - dosed in mg/24 hours  Place 1 patch (21 mg total) onto the skin daily.     Rivaroxaban 15 & 20 MG Tbpk  Take as directed: Start with one 15mg  tablet by mouth twice a day with food. On Day 22, switch to one 20mg  tablet once a day with food.     traMADol 50 MG tablet  Commonly known as:  ULTRAM  Take 1 tablet (50 mg total) by mouth every 6 (six) hours as needed for severe pain (breakthrough pain).        Discharge Instructions: Please refer to Patient Instructions section of EMR for full details.  Patient was counseled important signs and symptoms that should prompt return to medical care, changes in medications, dietary instructions, activity restrictions, and follow up appointments.   Follow-Up Appointments: Follow-up Information    Follow up with Levert Feinstein, MD. Go on 12/10/2015.   Specialty:  Family Medicine   Why:  10:30 a.m. hospital follow-up appointment. Please call to reschedule if this time does not work.   Contact information:   [address] [place] [address] [phone]       Casey Burkitt, MD 11/28/2015, 4:33 PM PGY-1, [facility] Family Medicine

## 2015-11-28 NOTE — Discharge Instructions (Signed)
[name],  You were found to have a pulmonary embolism (PE). Because this is your second PE, the current recommendation is to continue anticoagulation indefinitely. You will need to continue xarelto 15 mg twice daily for 3 weeks, then will take 20 mg daily. I have prescribed tramadol for pain but would recommend taking scheduled tylenol for the next few days. Taking non-steroidal anti-inflammatory medications (like ibuprofen) while on xarelto is not recommended, as it can increase your risk of bleeds. Continuing work-up for why you developed this blood clot will not change management, so at this time, I would not recommend further testing. I recommend calling your insurance company to participate in their co-pay program to help make xarelto more affordable as a long-term medication.   Smoking cessation will help reduce your risk of clots and heart disease in the long-term. I have prescribed a month of nicotine patches. I recommend picking a quit date, then starting the patches. Once you have completed a month of the 21 mg patches, you would switch to 14 mg patches for a month, then 7 mg patches for a month, then quit. If you choose to use this strategy, we can prescribe a lower dose patch to continue treatment at your next visit.   I have re-prescribed your wellbutrin to help with mood. Please discuss this with your psychiatrist.   Thank you for letting us take care of you.  Best, Dr. Sampson GoonFitzgerald    Information on my medicine - XARELTO (rivaroxaban)  This medication education was reviewed with me or my healthcare representative as part of my discharge preparation.  The pharmacist that spoke with me during my hospital stay was:  Almon HerculesBaird, Jourdon Zimmerle P, [facility]  WHY WAS Carlena HurlXARELTO PRESCRIBED FOR YOU? Xarelto was prescribed to treat blood clots that may have been found in the veins of your legs (deep vein thrombosis) or in your lungs (pulmonary embolism) and to reduce the risk of them occurring again.  What  do you need to know about Xarelto? The starting dose is one 15 mg tablet taken TWICE daily with food for the FIRST 21 DAYS then on 12/19/15, the dose is changed to one 20 mg tablet taken ONCE A DAY with your evening meal.  DO NOT stop taking Xarelto without talking to the health care provider who prescribed the medication.  Refill your prescription for 20 mg tablets before you run out.  After discharge, you should have regular check-up appointments with your healthcare provider that is prescribing your Xarelto.  In the future your dose may need to be changed if your kidney function changes by a significant amount.  What do you do if you miss a dose? If you are taking Xarelto TWICE DAILY and you miss a dose, take it as soon as you remember. You may take two 15 mg tablets (total 30 mg) at the same time then resume your regularly scheduled 15 mg twice daily the next day.  If you are taking Xarelto ONCE DAILY and you miss a dose, take it as soon as you remember on the same day then continue your regularly scheduled once daily regimen the next day. Do not take two doses of Xarelto at the same time.   Important Safety Information Xarelto is a blood thinner medicine that can cause bleeding. You should call your healthcare provider right away if you experience any of the following: ? Bleeding from an injury or your nose that does not stop. ? Unusual colored urine (red or dark brown) or  unusual colored stools (red or black). ? Unusual bruising for unknown reasons. ? A serious fall or if you hit your head (even if there is no bleeding).  Some medicines may interact with Xarelto and might increase your risk of bleeding while on Xarelto. To help avoid this, consult your healthcare provider or pharmacist prior to using any new prescription or non-prescription medications, including herbals, vitamins, non-steroidal anti-inflammatory drugs (NSAIDs) and supplements.  This website has more information on  Xarelto: [email or web address].br.
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
## 2015-11-28 NOTE — Progress Notes (Signed)
*  PRELIMINARY RESULTS* Vascular Ultrasound Lower extremity venous duplex has been completed.  Preliminary findings: No evidence of DVT or baker's cyst.   Farrel DemarkJill Eunice, RDMS, RVT  11/28/2015, 3:41 PM

## 2015-12-03 LAB — FACTOR 5 LEIDEN

## 2015-12-04 ENCOUNTER — Telehealth: Payer: Self-pay | Admitting: *Deleted

## 2015-12-04 NOTE — Telephone Encounter (Signed)
Pt mom calls and states that pt has been having a headache since he left the hospital.  She states that the tramadol and tylenol is not helping and actually for the last 2 days he has been taking excedrin instead of the tylenol.  He is taking the excedrin every 6 hours and mom wants to know if that is ok or if something else can be called in. He has a new patient appointment on 12/13/15. Fleeger, Maryjo RochesterJessica Dawn, CMA

## 2015-12-05 NOTE — Telephone Encounter (Signed)
Spoke first with patient's mother who raised concerns about patient's frequency of headaches since leaving the hospital. She said he has not been taking wellbutrin, which was prescribed at discharge for mood. Then called patient who reports he is needing to use excedrin about twice daily when pain medication wears off. It does improve the headaches. He also complains that he continues to have chest pain after his hospitalization for PE that is similar to the pain he came in with; it does improve with tramadol. He denies SOB. Asked patient to keep a headache journal to bring to his hospital follow-up appointment early next week to better address symptoms.

## 2015-12-10 ENCOUNTER — Ambulatory Visit (INDEPENDENT_AMBULATORY_CARE_PROVIDER_SITE_OTHER): Payer: 59 | Admitting: Family Medicine

## 2015-12-10 VITALS — BP 104/70 | HR 68 | Temp 98.1°F | Wt 179.8 lb

## 2015-12-10 DIAGNOSIS — R51 Headache: Secondary | ICD-10-CM | POA: Diagnosis not present

## 2015-12-10 DIAGNOSIS — Z720 Tobacco use: Secondary | ICD-10-CM

## 2015-12-10 DIAGNOSIS — I2699 Other pulmonary embolism without acute cor pulmonale: Secondary | ICD-10-CM | POA: Diagnosis not present

## 2015-12-10 DIAGNOSIS — F314 Bipolar disorder, current episode depressed, severe, without psychotic features: Secondary | ICD-10-CM | POA: Diagnosis not present

## 2015-12-10 DIAGNOSIS — R519 Headache, unspecified: Secondary | ICD-10-CM

## 2015-12-10 MED ORDER — TRAMADOL HCL 50 MG PO TABS: 50.0000 mg | ORAL_TABLET | Freq: Four times a day (QID) | ORAL | Status: DC | PRN

## 2015-12-10 MED ORDER — RIVAROXABAN 20 MG PO TABS: 20.0000 mg | ORAL_TABLET | Freq: Every day | ORAL | Status: DC

## 2015-12-10 NOTE — Progress Notes (Signed)
  HPI:  Jamie Hood presents for hospital follow up. Patient was hospitalized from 11/27/15 to 11/28/15 with acute PE. This was his second episode of pulmonary embolus. Discharged on xarelto with plan for lifelong anticoagulation.  Of note he called in on 6/27 complaining of headaches. Still having headaches, waking up with them daily. Still has some mild chest pain from the PE, but overall improving. Thinks headache may also be improving slightly. He and his mom are worried about possible clot moving to his head causing the headache. Also wonders if it is a side effect of xarelto.  Tobacco abuse - using a 21mg  over the counter patch. Still smoking some.  Mood - has history of bipolar disorder. Discharged with rx for wellbutrin but he has not filled it. Wants to wait and see his psychiatrist first. Denies thoughts of harming himself or others.  ROS: See HPI.  PMFSH: history of PEx2, bipolar disorder, tobacco abuse, alcohol abuse  PHYSICAL EXAM: BP 104/70 mmHg  Pulse 68  Temp(Src) 98.1 F (36.7 C) (Oral)  Wt 179 lb 12.8 oz (81.557 kg)  SpO2 100% Gen: no acute distress, pleasant, cooperative HEENT: normocephalic, atraumatic, moist mucous membranes  Heart: regular rate and rhythm, no murmur Lungs: clear to auscultation bilaterally, normal work of breathing  Neuro: cranial nerves II-XII tested and intact. Speech normal. Full strength bilat upper and lower ext. Normal FNF. Negative romberg. Ext: atraumatic, no edema  ASSESSMENT/PLAN:  Pulmonary embolism (HCC) Two prior PE's = lifelong anticoagulation. Refill of xarelto 20mg  daily to be started after he finishes the starter pack. Needs repeat CTA vs VQ scan in December 2017 Also needs PFTs and sleep study - hold on these for now given still in subacute phase of PE. He would like referral to pulmonology. His mom sees Jamie Hood & would like him to see Jamie Hood as well. Referral entered. Refill small quantity of tramadol - discussed this  will be the last fill as it is in his best interest to utilize non-opioid medications for pain control. He will also use tylenol.  Tobacco abuse Encouraged continued cessation. He is working on this. Follow up with new PCP Jamie Hood in a few weeks to see how it's going.  Bipolar affective disorder, depressed, severe (HCC) Stable at present, off medications. He is planning to follow up with psychiatry. Affirmed this as a good idea.  Headache Daily headaches. He is interested in imaging to ensure no intracranial process. Echo was negative for PFO so doubt headaches are directly related to his PE, though he is obviously hypercoaguable in general. Neuro exam normal. Will start with CT head as he is on anticoagulation. Follow up with PCP in a few weeks. If headache still persisting then and CT head is negative, consider MRI to further evaluate. Patient & mother agreeable to this plan.   FOLLOW UP: Follow up in 3 weeks with new PCP for above issues. Schedule with psychiatry Referring to pulmonology  Jamie J. Pollie MeyerMcIntyre, MD [facility]

## 2015-12-10 NOTE — Patient Instructions (Signed)
It was nice to meet you today!  Sent in xarelto prescription for you Checking CT scan of your head since you've had the headaches Referring to Dr. Delton CoombesByrum, someone will call you with that appointment Refill tramadol - small quantity. No further fills of this after today.  Follow up with Dr. Sampson GoonFitzgerald in 3 weeks, sooner if needed  Be well, Dr. Pollie MeyerMcIntyre

## 2015-12-12 NOTE — Assessment & Plan Note (Addendum)
Two prior PE's = lifelong anticoagulation. Refill of xarelto 20mg  daily to be started after he finishes the starter pack. Needs repeat CTA vs VQ scan in December 2017 Also needs PFTs and sleep study - hold on these for now given still in subacute phase of PE. He would like referral to pulmonology. His mom sees Dr. Delton CoombesByrum & would like him to see Dr. Delton CoombesByrum as well. Referral entered. Refill small quantity of tramadol - discussed this will be the last fill as it is in his best interest to utilize non-opioid medications for pain control. He will also use tylenol.

## 2015-12-13 DIAGNOSIS — R51 Headache: Secondary | ICD-10-CM

## 2015-12-13 DIAGNOSIS — R519 Headache, unspecified: Secondary | ICD-10-CM | POA: Insufficient documentation

## 2015-12-13 NOTE — Assessment & Plan Note (Signed)
Encouraged continued cessation. He is working on this. Follow up with new PCP Dr. Sampson GoonFitzgerald in a few weeks to see how it's going.

## 2015-12-13 NOTE — Assessment & Plan Note (Signed)
Daily headaches. He is interested in imaging to ensure no intracranial process. Echo was negative for PFO so doubt headaches are directly related to his PE, though he is obviously hypercoaguable in general. Neuro exam normal. Will start with CT head as he is on anticoagulation. Follow up with PCP in a few weeks. If headache still persisting then and CT head is negative, consider MRI to further evaluate. Patient & mother agreeable to this plan.

## 2015-12-13 NOTE — Assessment & Plan Note (Signed)
Stable at present, off medications. He is planning to follow up with psychiatry. Affirmed this as a good idea.

## 2015-12-17 ENCOUNTER — Ambulatory Visit (HOSPITAL_COMMUNITY)
Admission: RE | Admit: 2015-12-17 | Discharge: 2015-12-17 | Disposition: A | Discharge status: Home / Self Care | Payer: 59 | Source: Ambulatory Visit | Attending: Family Medicine | Admitting: Family Medicine

## 2015-12-17 ENCOUNTER — Telehealth: Payer: Self-pay | Admitting: Internal Medicine

## 2015-12-17 DIAGNOSIS — R51 Headache: Secondary | ICD-10-CM | POA: Diagnosis present

## 2015-12-17 DIAGNOSIS — R519 Headache, unspecified: Secondary | ICD-10-CM

## 2015-12-17 NOTE — Telephone Encounter (Signed)
Placed in MDs box. Jamie Hood, CMA  

## 2015-12-17 NOTE — Telephone Encounter (Signed)
Pt dropped of FMLA papers to be filled out. Please call when ready. Forms are in the [employer] folder. [email or web address]

## 2015-12-18 ENCOUNTER — Encounter: Payer: Self-pay | Admitting: Family Medicine

## 2015-12-18 NOTE — Telephone Encounter (Signed)
Pt calling into check on paperwork, was told it takes 24- to 48 hours. He also needs work restrictions added into paperwork as well. Alanii Ramer Bruna PotterBlount, CMA

## 2015-12-18 NOTE — Telephone Encounter (Signed)
Completed. Placed in Tamika's box. 

## 2015-12-19 NOTE — Telephone Encounter (Signed)
Left voice message for that FMLA form is complete and ready for pick up.  Forms copied for scanning in patient's record.  Clovis PuMartin, Arlin Sass L, RN

## 2016-01-01 ENCOUNTER — Encounter: Payer: Self-pay | Admitting: Internal Medicine

## 2016-01-01 ENCOUNTER — Ambulatory Visit (INDEPENDENT_AMBULATORY_CARE_PROVIDER_SITE_OTHER): Payer: 59 | Admitting: Internal Medicine

## 2016-01-01 VITALS — BP 114/73 | HR 69 | Temp 98.5°F | Ht 72.0 in | Wt 179.2 lb

## 2016-01-01 DIAGNOSIS — M25561 Pain in right knee: Secondary | ICD-10-CM

## 2016-01-01 DIAGNOSIS — Z13220 Encounter for screening for lipoid disorders: Secondary | ICD-10-CM

## 2016-01-01 DIAGNOSIS — J020 Streptococcal pharyngitis: Secondary | ICD-10-CM | POA: Diagnosis not present

## 2016-01-01 DIAGNOSIS — I2699 Other pulmonary embolism without acute cor pulmonale: Secondary | ICD-10-CM | POA: Diagnosis not present

## 2016-01-01 LAB — LIPID PANEL
CHOL/HDL RATIO: 3.1 ratio (ref <=5.0)
Cholesterol: 159 mg/dL (ref 125–200)
HDL: 52 mg/dL (ref >=40)
LDL Cholesterol: 75 mg/dL (ref <130)
TRIGLYCERIDES: 162 mg/dL — AB (ref <150)
VLDL: 32 mg/dL — ABNORMAL HIGH (ref <30)

## 2016-01-01 LAB — POCT RAPID STREP A (OFFICE): RAPID STREP A SCREEN: POSITIVE — AB

## 2016-01-01 MED ORDER — RIVAROXABAN 20 MG PO TABS: 20.0000 mg | ORAL_TABLET | Freq: Every day | ORAL | 5 refills | Status: DC

## 2016-01-01 MED ORDER — PENICILLIN V POTASSIUM 500 MG PO TABS: 500.0000 mg | ORAL_TABLET | Freq: Two times a day (BID) | ORAL | 0 refills | Status: AC

## 2016-01-01 MED ORDER — METHYLPREDNISOLONE ACETATE 40 MG/ML IJ SUSP
40.0000 mg | Freq: Once | INTRAMUSCULAR | Status: AC
  Administered 2016-01-01: 40 mg via INTRA_ARTICULAR

## 2016-01-01 NOTE — Patient Instructions (Signed)
[name],  Please make an appointment with [place] after you have seen pulmonology.  I have placed orders for knee x-rays. You can get these done during business hours at [facility]. This will be helpful if we continue to do knee x-rays in the future.  Please call us if you have worsening knee pain, swelling, redness or fevers.  Continue taking xarelto daily.  Best, Dr. Sampson Goon
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
## 2016-01-02 ENCOUNTER — Encounter: Payer: Self-pay | Admitting: Internal Medicine

## 2016-01-06 DIAGNOSIS — J020 Streptococcal pharyngitis: Secondary | ICD-10-CM | POA: Insufficient documentation

## 2016-01-06 DIAGNOSIS — M25561 Pain in right knee: Secondary | ICD-10-CM | POA: Insufficient documentation

## 2016-01-06 NOTE — Assessment & Plan Note (Signed)
-   Stable. No worsening of chest pain or SOB. - Refilled xarelto. - Placed referral to pulmonology again, with patient requesting Dr. Delton Coombes. Suspect Dr. Valorie Roosevelt referral was not completed because of phone trouble on patient's end. Will need PFTs and sleep study but may be more beneficial a couple more months out from starting anticoagulation. - Will need V/Q scan vs. CTA chest repeated Dec 2017
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
## 2016-01-06 NOTE — Assessment & Plan Note (Signed)
-   Rapid strep performed and was positive. - Prescribed penicillin

## 2016-03-21 ENCOUNTER — Ambulatory Visit (INDEPENDENT_AMBULATORY_CARE_PROVIDER_SITE_OTHER): Payer: 59 | Admitting: Emergency Medicine

## 2016-03-21 ENCOUNTER — Encounter: Payer: Self-pay | Admitting: Emergency Medicine

## 2016-03-21 VITALS — BP 122/76 | HR 76 | Ht 73.0 in | Wt 186.0 lb

## 2016-03-21 DIAGNOSIS — I2699 Other pulmonary embolism without acute cor pulmonale: Secondary | ICD-10-CM

## 2016-03-21 DIAGNOSIS — I2782 Chronic pulmonary embolism: Secondary | ICD-10-CM

## 2016-03-21 DIAGNOSIS — Z23 Encounter for immunization: Secondary | ICD-10-CM

## 2016-03-21 NOTE — Assessment & Plan Note (Signed)
Second pulmonary embolism. He will need anticoagulation for life as long as he can tolerate. Skin test with him the potential benefits of performing a hypercoagulability panel in December after is being treated for 6 months. He will need to come off the anticoagulation temporarily in order to get these labs drawn. We will investigate the appropriate timing to be off of Xarelto if we decide to do this. He also needs a repeat TTE in December to assess RV fxn and PAH, compare with prior. Not clear that he needs a repeat V/q in Dec.

## 2016-03-21 NOTE — Patient Instructions (Addendum)
You will probably need to stay on Xarelto or some form of anticoagulation for life We will perform an echocardiogram in December to compare with June.  We will discuss stopping Xarelto temporarily in December to facilitate hypercoagulability lab work.  Follow with Dr Delton CoombesByrum in December to review your echocardiogram.

## 2016-03-21 NOTE — Progress Notes (Signed)
Subjective:    Patient ID: Jamie Hood, male    DOB: [birth year], 30 y.o.   MRN: [id number]  HPI 30 yo man, current smoker (2 pk-yrs), carries a hx of childhood asthma, bipolar. He was dx with his 2nd PE by CT-PA done 11/27/15. The first was 06/13/13 treated w xarelto x 6 months. No hypercoag panel done at that time. This June he had chest pain and then L arm numbness. He now feels better, is approaching baseline. He does occasionally have CP, difficult to discern whether this relates to PE vs her anxiety d/o.    Review of Systems  Constitutional: Negative for fever and unexpected weight change.  HENT: Positive for congestion and postnasal drip. Negative for dental problem, ear pain, nosebleeds, rhinorrhea, sinus pressure, sneezing, sore throat and trouble swallowing.   Eyes: Negative for redness and itching.  Respiratory: Positive for cough. Negative for chest tightness, shortness of breath and wheezing.   Cardiovascular: Negative for palpitations and leg swelling.  Gastrointestinal: Negative for nausea and vomiting.  Genitourinary: Negative for dysuria.  Musculoskeletal: Negative for joint swelling.  Skin: Negative for rash.  Neurological: Negative for headaches.  Hematological: Does not bruise/bleed easily.  Psychiatric/Behavioral: Negative for dysphoric mood. The patient is not nervous/anxious.     Past Medical History:  Diagnosis Date  . Asthma   . PE (pulmonary embolism)      Family History  Problem Relation Age of Onset  . Schizophrenia Father      Social History   Social History  . Marital status: Single    Spouse name: N/A  . Number of children: N/A  . Years of education: N/A   Occupational History  . Not on file.   Social History Main Topics  . Smoking status: Current Every Day Smoker    Packs/day: 0.25    Years: 8.00  . Smokeless tobacco: Never Used  . Alcohol use Yes     Comment: daily  . Drug use:     Types: Marijuana  . Sexual activity: Yes    Other Topics Concern  . Not on file   Social History Narrative  . No narrative on file     Allergies  Allergen Reactions  . Shellfish Allergy Anaphylaxis     Outpatient Medications Prior to Visit  Medication Sig Dispense Refill  . acetaminophen (TYLENOL) 325 MG tablet Take 2 tablets (650 mg total) by mouth every 6 (six) hours as needed for mild pain or moderate pain. 30 tablet 0  . hydrocortisone cream 1 % Apply topically 2 (two) times daily. 30 g 0  . rivaroxaban (XARELTO) 20 MG TABS tablet Take 1 tablet (20 mg total) by mouth daily with supper. 30 tablet 5   No facility-administered medications prior to visit.         Objective:   Physical Exam Vitals:   03/21/16 1627 03/21/16 1630  BP:  122/76  Pulse:  76  SpO2:  98%  Weight: 186 lb (84.4 kg)   Height: 6\' 1"  (1.854 m)    Gen: Pleasant, well-nourished, in no distress,  normal affect  ENT: No lesions,  mouth clear,  oropharynx clear, no postnasal drip  Neck: No JVD, no TMG, no carotid bruits  Lungs: No use of accessory muscles, no dullness to percussion, clear without rales or rhonchi  Cardiovascular: RRR, heart sounds normal, no murmur or gallops, no peripheral edema  Musculoskeletal: No deformities, no cyanosis or clubbing  Neuro: alert, non focal  Skin: Warm, no lesions  or rashes     Assessment & Plan:  Pulmonary embolism (HCC) Second pulmonary embolism. He will need anticoagulation for life as long as he can tolerate. Skin test with him the potential benefits of performing a hypercoagulability panel in December after is being treated for 6 months. He will need to come off the anticoagulation temporarily in order to get these labs drawn. We will investigate the appropriate timing to be off of Xarelto if we decide to do this. He also needs a repeat TTE in December to assess RV fxn and PAH, compare with prior. Not clear that he needs a repeat V/q in Dec.   Levy Pupaobert Danyka Merlin, MD, PhD 03/21/2016, 5:03 PM [facility] [phone] or if no answer [phone]

## 2016-05-12 ENCOUNTER — Other Ambulatory Visit (HOSPITAL_COMMUNITY): Payer: Self-pay

## 2016-05-21 ENCOUNTER — Ambulatory Visit: Payer: Self-pay | Admitting: Emergency Medicine

## 2016-06-04 ENCOUNTER — Other Ambulatory Visit (HOSPITAL_COMMUNITY): Payer: Self-pay

## 2016-08-14 ENCOUNTER — Telehealth: Payer: Self-pay | Admitting: *Deleted

## 2016-08-14 ENCOUNTER — Encounter: Payer: Self-pay | Admitting: *Deleted

## 2016-08-14 NOTE — Telephone Encounter (Signed)
A message was left today to reschedule his echocardiogram and a letter mailed.[name] has cancel this appointment twice.

## 2017-06-10 ENCOUNTER — Other Ambulatory Visit: Payer: Self-pay

## 2017-06-10 ENCOUNTER — Emergency Department (HOSPITAL_COMMUNITY): Payer: 59

## 2017-06-10 ENCOUNTER — Encounter (HOSPITAL_COMMUNITY): Payer: Self-pay | Admitting: Emergency Medicine

## 2017-06-10 DIAGNOSIS — Z7901 Long term (current) use of anticoagulants: Secondary | ICD-10-CM | POA: Insufficient documentation

## 2017-06-10 DIAGNOSIS — F17210 Nicotine dependence, cigarettes, uncomplicated: Secondary | ICD-10-CM | POA: Diagnosis not present

## 2017-06-10 DIAGNOSIS — R079 Chest pain, unspecified: Secondary | ICD-10-CM | POA: Diagnosis not present

## 2017-06-10 DIAGNOSIS — J45909 Unspecified asthma, uncomplicated: Secondary | ICD-10-CM | POA: Insufficient documentation

## 2017-06-10 DIAGNOSIS — Z79899 Other long term (current) drug therapy: Secondary | ICD-10-CM | POA: Insufficient documentation

## 2017-06-10 DIAGNOSIS — R918 Other nonspecific abnormal finding of lung field: Secondary | ICD-10-CM | POA: Diagnosis not present

## 2017-06-10 LAB — CBC WITH DIFFERENTIAL/PLATELET
BASOS PCT: 0 %
Basophils Absolute: 0 10*3/uL (ref 0.0–0.1)
EOS ABS: 0.2 10*3/uL (ref 0.0–0.7)
EOS PCT: 3 %
HCT: 41 % (ref 39.0–52.0)
Hemoglobin: 13.5 g/dL (ref 13.0–17.0)
LYMPHS ABS: 2.1 10*3/uL (ref 0.7–4.0)
Lymphocytes Relative: 37 %
MCH: 28.1 pg (ref 26.0–34.0)
MCHC: 32.9 g/dL (ref 30.0–36.0)
MCV: 85.2 fL (ref 78.0–100.0)
MONO ABS: 0.3 10*3/uL (ref 0.1–1.0)
MONOS PCT: 6 %
Neutro Abs: 3.2 10*3/uL (ref 1.7–7.7)
Neutrophils Relative %: 54 %
PLATELETS: 185 10*3/uL (ref 150–400)
RBC: 4.81 MIL/uL (ref 4.22–5.81)
RDW: 13.9 % (ref 11.5–15.5)
WBC: 5.8 10*3/uL (ref 4.0–10.5)

## 2017-06-10 LAB — I-STAT TROPONIN, ED: TROPONIN I, POC: 0 ng/mL (ref 0.00–0.08)

## 2017-06-10 NOTE — ED Triage Notes (Signed)
Pt c/o right sided chest pain x's 3 weeks.  Pt st's pain feels the same as it did when he had a  PE

## 2017-06-11 ENCOUNTER — Emergency Department (HOSPITAL_COMMUNITY): Payer: 59

## 2017-06-11 ENCOUNTER — Emergency Department (HOSPITAL_COMMUNITY)
Admission: EM | Admit: 2017-06-11 | Discharge: 2017-06-11 | Disposition: A | Discharge status: Home / Self Care | Payer: 59 | Attending: Emergency Medicine | Admitting: Emergency Medicine

## 2017-06-11 DIAGNOSIS — R079 Chest pain, unspecified: Secondary | ICD-10-CM

## 2017-06-11 DIAGNOSIS — R918 Other nonspecific abnormal finding of lung field: Secondary | ICD-10-CM

## 2017-06-11 HISTORY — DX: Unspecified firearm discharge, undetermined intent, initial encounter: Y24.9XXA

## 2017-06-11 HISTORY — DX: Accidental discharge from unspecified firearms or gun, initial encounter: W34.00XA

## 2017-06-11 LAB — COMPREHENSIVE METABOLIC PANEL
ALBUMIN: 3.8 g/dL (ref 3.5–5.0)
ALK PHOS: 88 U/L (ref 38–126)
ALT: 13 U/L — AB (ref 17–63)
AST: 17 U/L (ref 15–41)
Anion gap: 7 (ref 5–15)
BILIRUBIN TOTAL: 0.7 mg/dL (ref 0.3–1.2)
BUN: 13 mg/dL (ref 6–20)
CALCIUM: 9.1 mg/dL (ref 8.9–10.3)
CO2: 25 mmol/L (ref 22–32)
Chloride: 109 mmol/L (ref 101–111)
Creatinine, Ser: 1.14 mg/dL (ref 0.61–1.24)
GFR calc Af Amer: >60 mL/min (ref >60)
GFR calc non Af Amer: >60 mL/min (ref >60)
GLUCOSE: 89 mg/dL (ref 65–99)
Potassium: 3.9 mmol/L (ref 3.5–5.1)
SODIUM: 141 mmol/L (ref 135–145)
TOTAL PROTEIN: 6.8 g/dL (ref 6.5–8.1)

## 2017-06-11 LAB — RAPID URINE DRUG SCREEN, HOSP PERFORMED
AMPHETAMINES: NOT DETECTED
BARBITURATES: NOT DETECTED
Benzodiazepines: NOT DETECTED
Cocaine: NOT DETECTED
Opiates: NOT DETECTED
Tetrahydrocannabinol: NOT DETECTED

## 2017-06-11 MED ORDER — AMOXICILLIN-POT CLAVULANATE 875-125 MG PO TABS: 1.0000 | ORAL_TABLET | Freq: Two times a day (BID) | ORAL | 0 refills | Status: AC

## 2017-06-11 MED ORDER — AMOXICILLIN-POT CLAVULANATE 875-125 MG PO TABS
1.0000 | ORAL_TABLET | Freq: Once | ORAL | Status: AC
  Administered 2017-06-11: 1 via ORAL
  Filled 2017-06-11: qty 1

## 2017-06-11 MED ORDER — IOPAMIDOL (ISOVUE-370) INJECTION 76%
INTRAVENOUS | Status: AC
  Administered 2017-06-11: 100 mL
  Filled 2017-06-11: qty 100

## 2017-06-11 NOTE — Discharge Instructions (Signed)
Take the prescribed medication as directed. Follow-up with pulmonology clinic in 2 weeks-- you will need repeat chest x-ray. Return to the ED for new or worsening symptoms-- worsening pain, coughing up blood, high fever, chills, sweats, etc.

## 2017-06-11 NOTE — ED Notes (Signed)
Patient transported to CT 
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
## 2017-06-12 LAB — ANCA TITERS: Atypical P-ANCA titer: <1:20 {titer}

## 2017-06-12 LAB — MPO/PR-3 (ANCA) ANTIBODIES
ANCA Proteinase 3: <3.5 U/mL (ref 0.0–3.5)
Myeloperoxidase Abs: <9 U/mL (ref 0.0–9.0)

## 2017-06-16 LAB — CULTURE, BLOOD (ROUTINE X 2)
CULTURE: NO GROWTH
Culture: NO GROWTH
SPECIAL REQUESTS: ADEQUATE
Special Requests: ADEQUATE

## 2018-02-03 ENCOUNTER — Ambulatory Visit
Admission: RE | Admit: 2018-02-03 | Discharge: 2018-02-03 | Disposition: A | Discharge status: Home / Self Care | Payer: 59 | Source: Ambulatory Visit | Attending: Family Medicine | Admitting: Family Medicine

## 2018-02-03 ENCOUNTER — Other Ambulatory Visit: Payer: Self-pay | Admitting: Family Medicine

## 2018-02-03 DIAGNOSIS — M1711 Unilateral primary osteoarthritis, right knee: Secondary | ICD-10-CM

## 2018-02-03 DIAGNOSIS — M1712 Unilateral primary osteoarthritis, left knee: Secondary | ICD-10-CM

## 2018-02-03 DIAGNOSIS — R52 Pain, unspecified: Secondary | ICD-10-CM

## 2018-03-08 ENCOUNTER — Telehealth: Payer: Self-pay | Admitting: Family Medicine

## 2018-03-08 NOTE — Telephone Encounter (Signed)
Left general message for pt regarding health maintenance appt.  °

## 2020-04-02 IMAGING — CR DG LUMBAR SPINE COMPLETE 4+V
5 series · 5 of 5 positions shown · non-contrast
Comparison: None.

CLINICAL DATA: Low back pain without known injury.

EXAM:
LUMBAR SPINE - COMPLETE 4+ VIEW

[t l-spine a.p.]
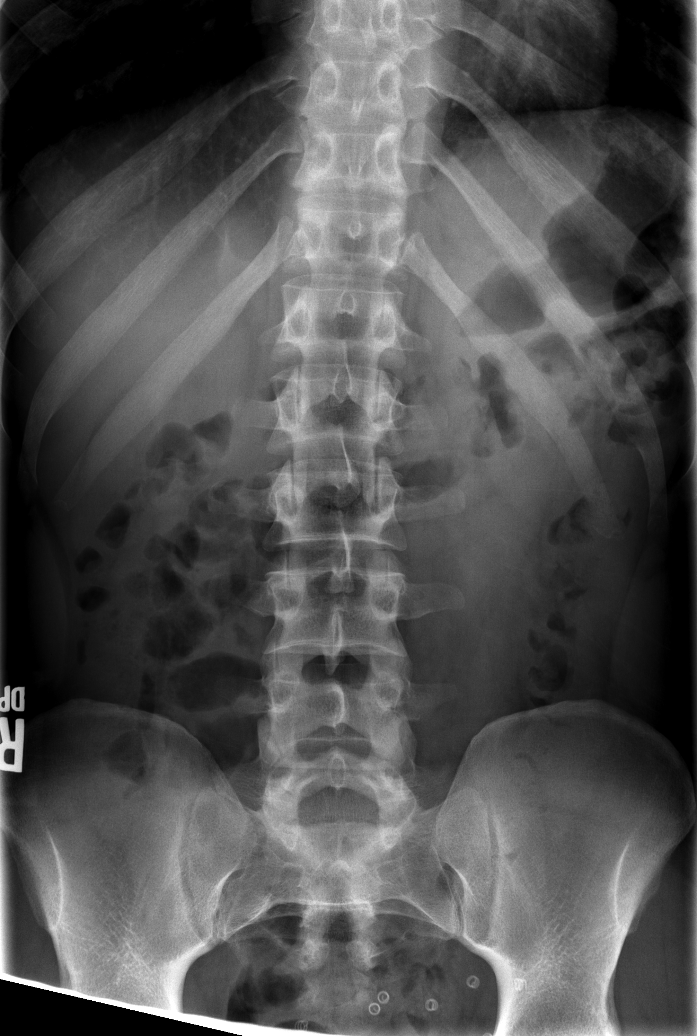

[t l-spine oblique exposure (1 of 2)]
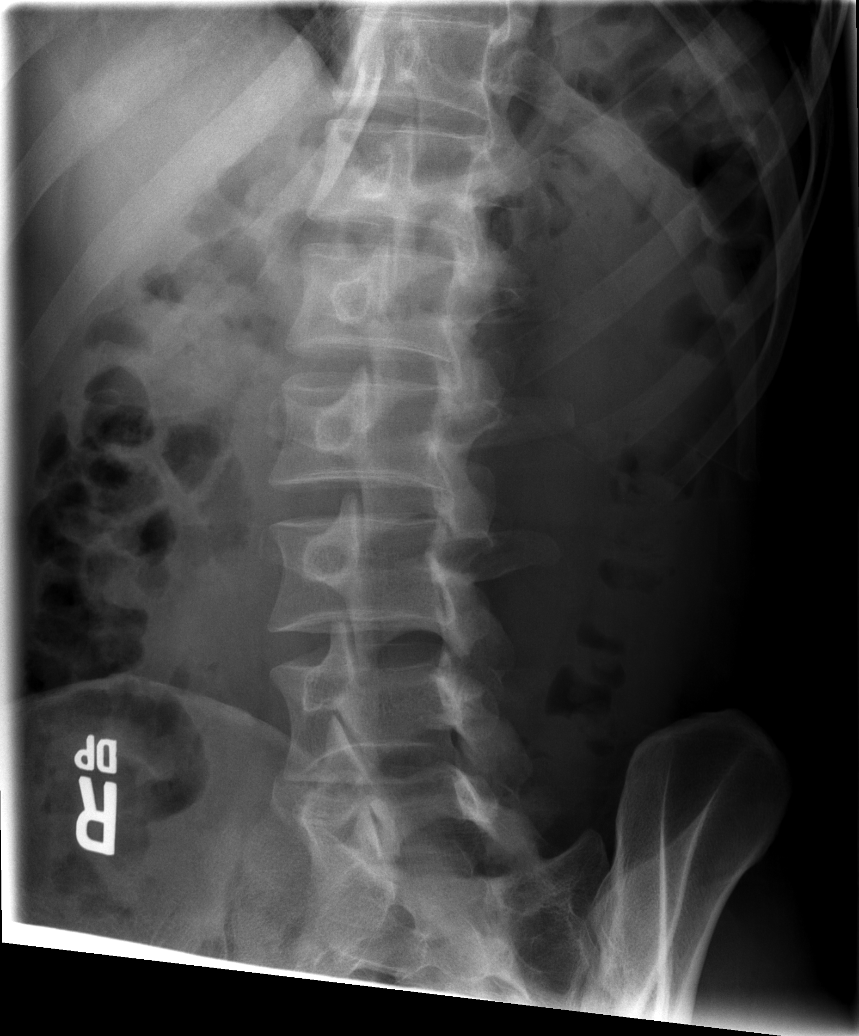

[t l-spine oblique exposure (2 of 2)]
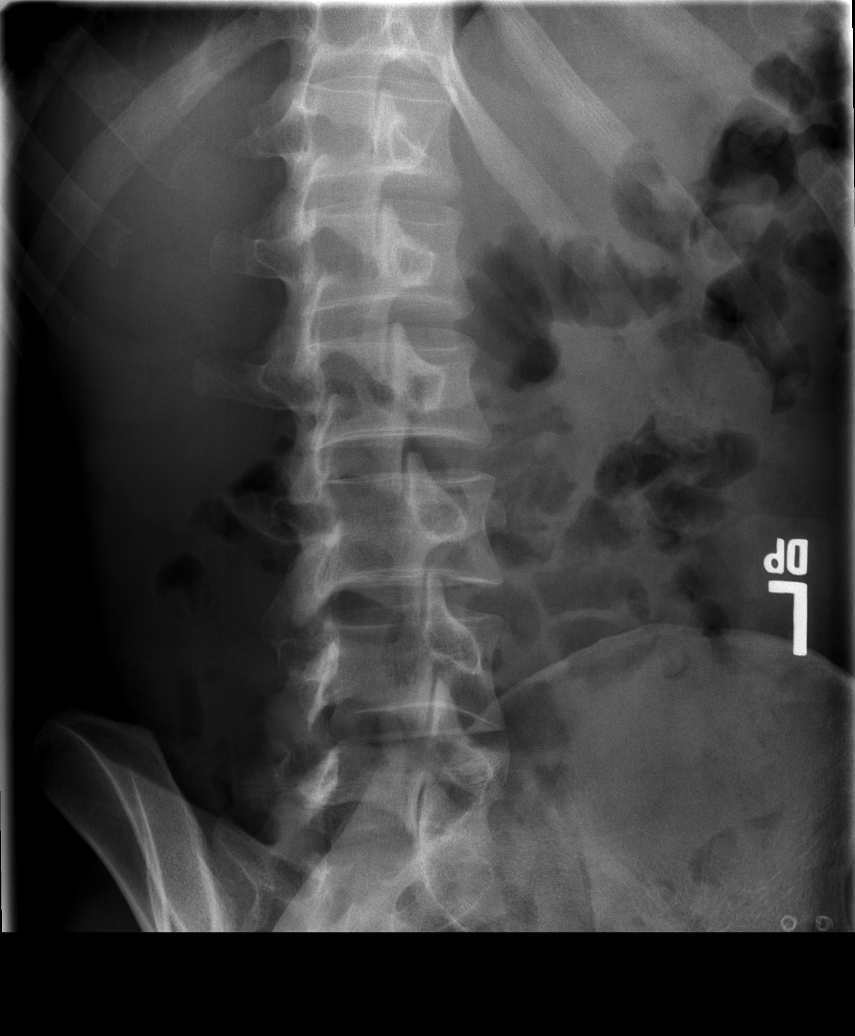

[t l-spine lat]
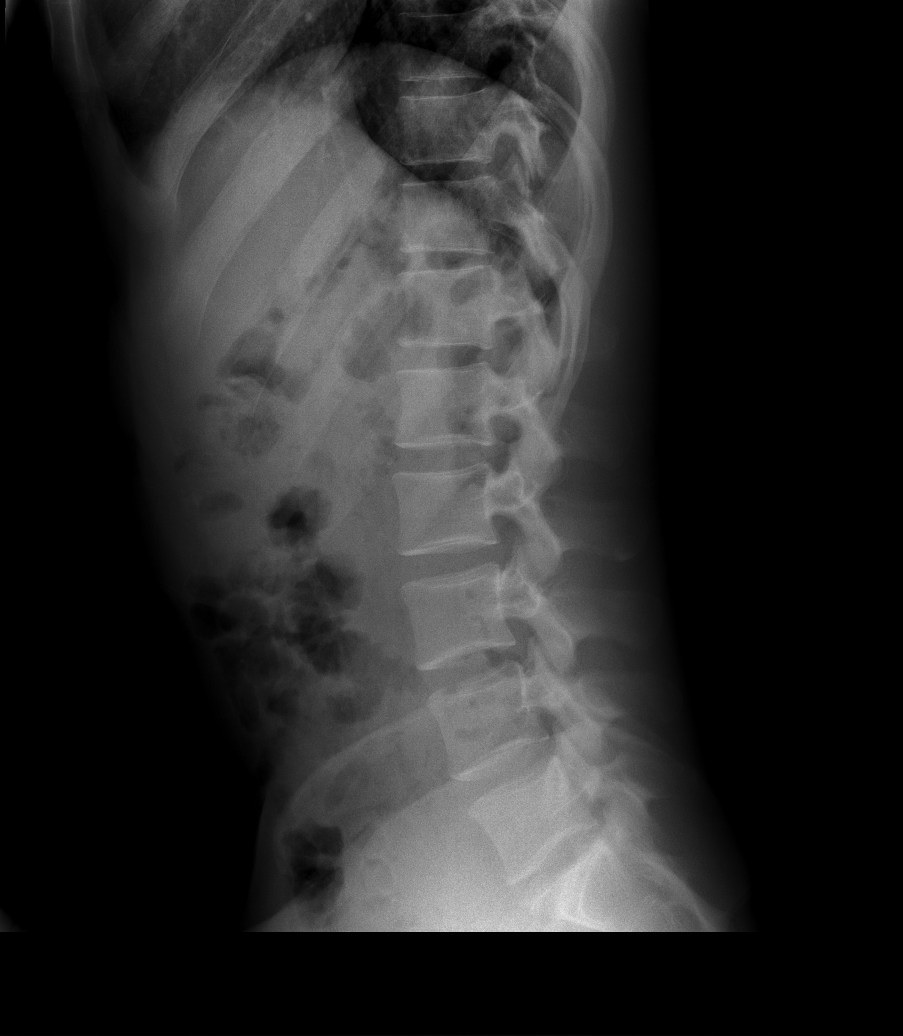

[t l-spine l5-s1 spot]
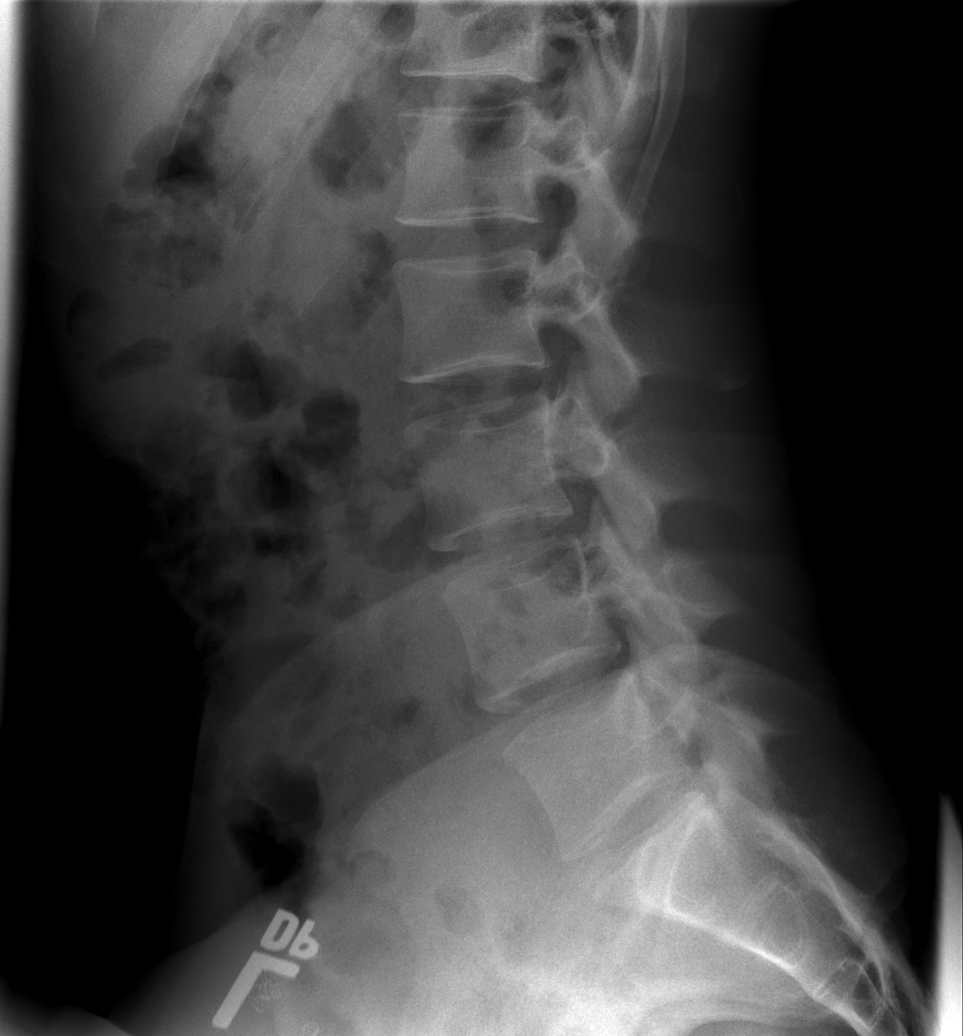

[5 of 5 positions shown; findings below may reference images not displayed]

FINDINGS: There is no evidence of lumbar spine fracture. Alignment is normal.
Intervertebral disc spaces are maintained.
IMPRESSION: Normal lumbar spine.

## 2020-04-02 IMAGING — CR DG CERVICAL SPINE COMPLETE 4+V
5 series · 5 of 5 positions shown · non-contrast
Comparison: None.

CLINICAL DATA: Neck pain without known injury.

EXAM:
CERVICAL SPINE - COMPLETE 4+ VIEW

[w c-spine lat]
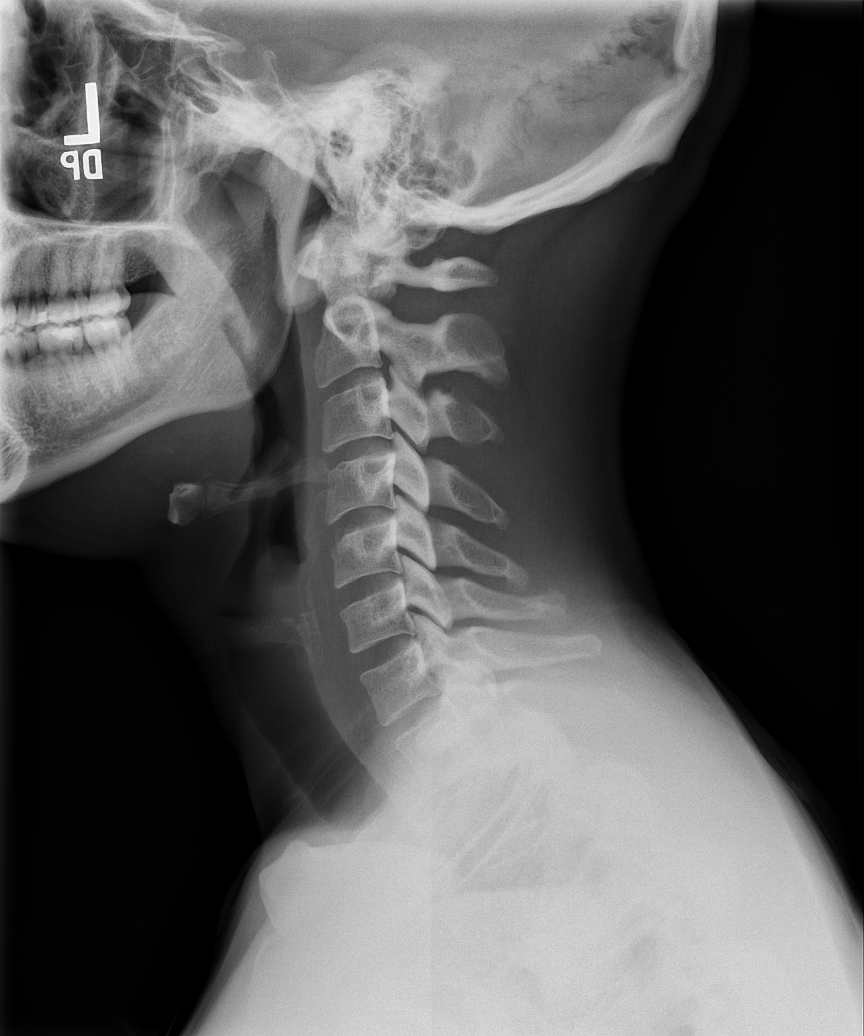

[w c-spine oblique (1 of 2)]
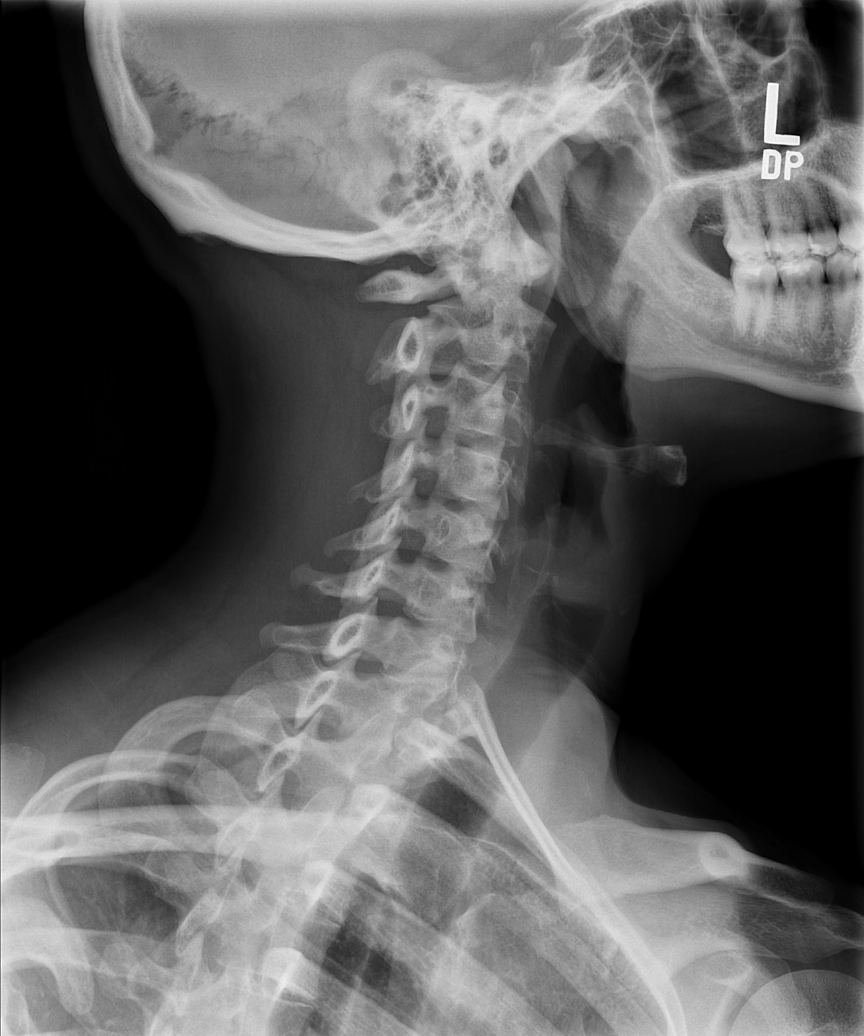

[w c-spine oblique (2 of 2)]
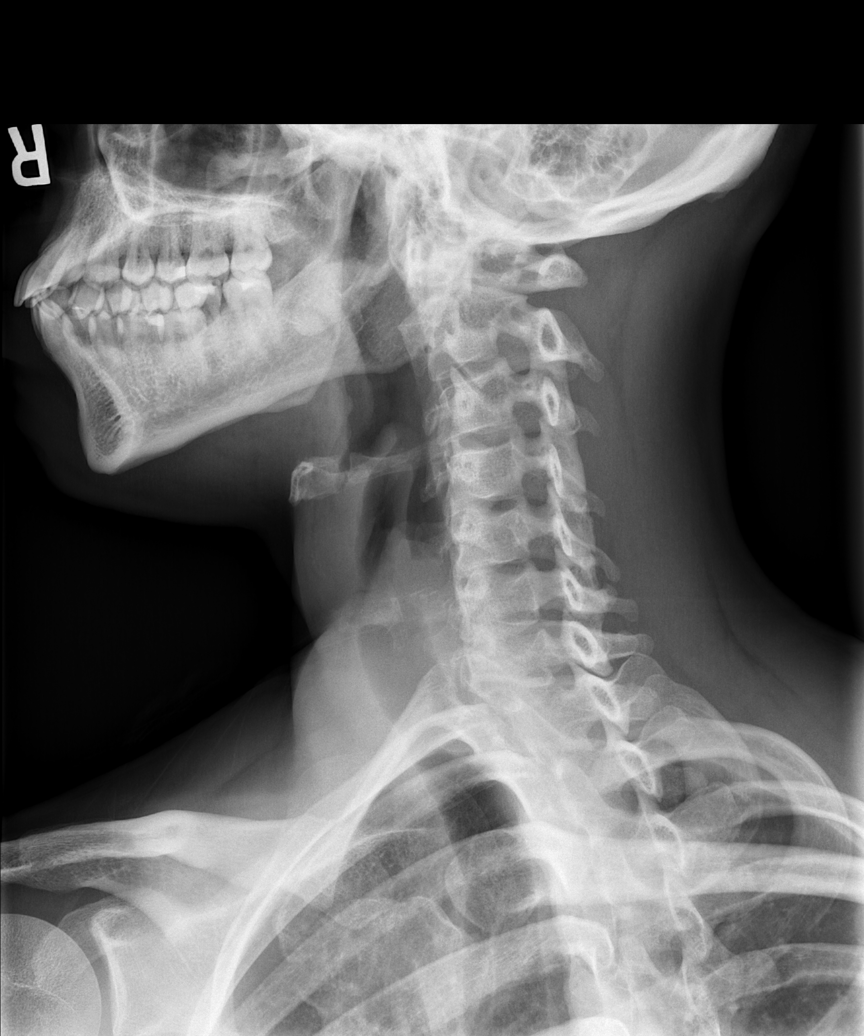

[w c-spine a.p. *]
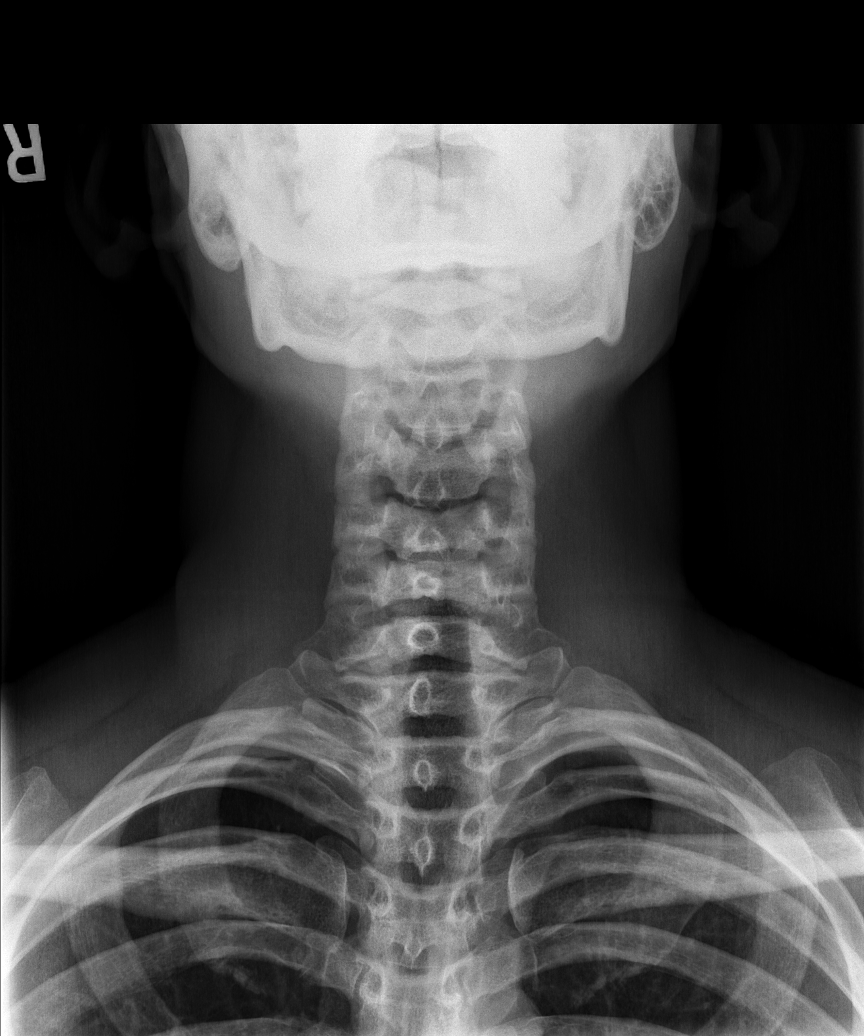

[w c-spine odontoid *]
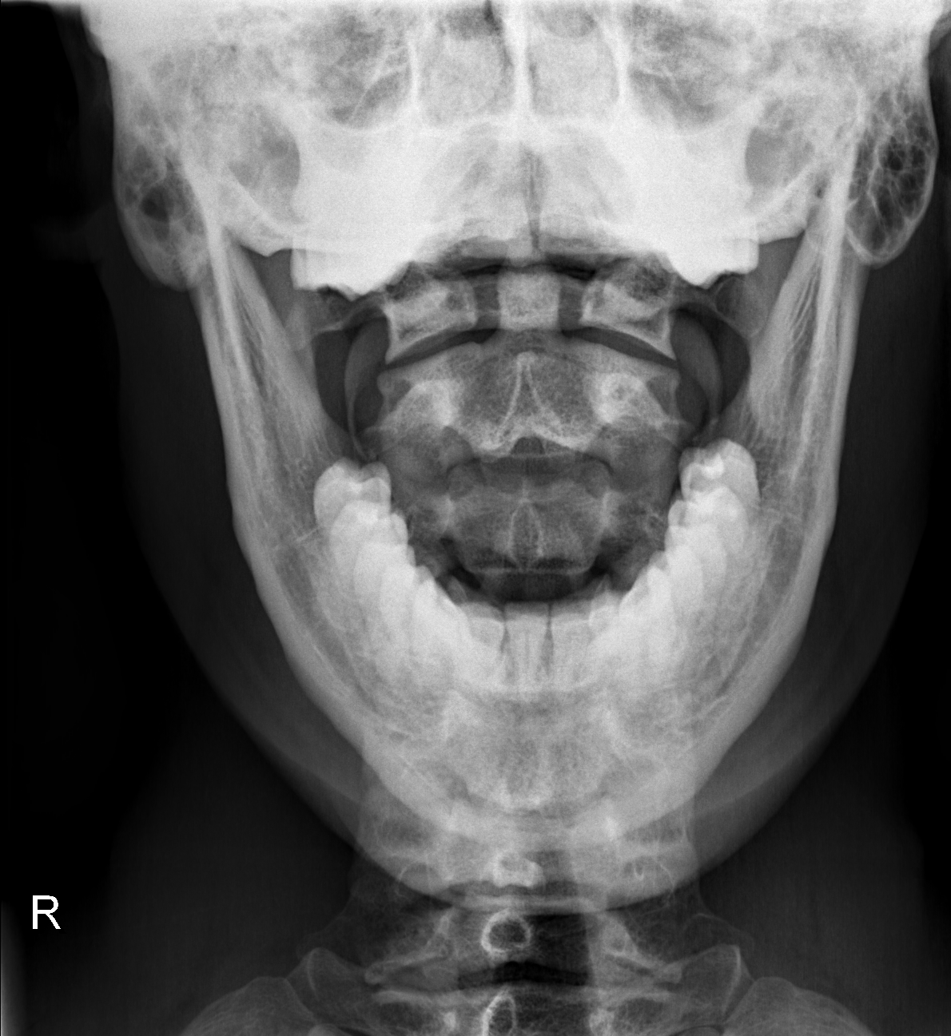

[5 of 5 positions shown; findings below may reference images not displayed]

FINDINGS: There is no evidence of cervical spine fracture or prevertebral soft
tissue swelling. Alignment is normal. No other significant bone
abnormalities are identified. No significant neural foraminal
stenosis is noted.
IMPRESSION: Negative cervical spine radiographs.
